# Patient Record
Sex: Female | Born: 1995 | Race: White | Hispanic: No | Marital: Married | Smoking: Never smoker
Health system: Southern US, Community
[De-identification: ages and names within clinical notes are randomized; demographics above are authoritative.]

## PROBLEM LIST (undated history)

## (undated) DIAGNOSIS — F419 Anxiety disorder, unspecified: Secondary | ICD-10-CM

## (undated) HISTORY — PX: ECTOPIC PREGNANCY SURGERY: SHX613

## (undated) HISTORY — DX: Anxiety disorder, unspecified: F41.9

## (undated) HISTORY — PX: KNEE SURGERY: SHX244

---
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
## 2021-06-29 ENCOUNTER — Ambulatory Visit (INDEPENDENT_AMBULATORY_CARE_PROVIDER_SITE_OTHER): Payer: BC Managed Care – PPO | Admitting: Certified Nurse Midwife

## 2021-06-29 ENCOUNTER — Other Ambulatory Visit: Payer: Self-pay

## 2021-06-29 ENCOUNTER — Encounter: Payer: Self-pay | Admitting: Certified Nurse Midwife

## 2021-06-29 VITALS — BP 116/78 | HR 76 | Ht 64.0 in | Wt 154.2 lb

## 2021-06-29 DIAGNOSIS — Z3200 Encounter for pregnancy test, result unknown: Secondary | ICD-10-CM | POA: Diagnosis not present

## 2021-06-29 LAB — POCT URINE PREGNANCY: Preg Test, Ur: NEGATIVE

## 2021-06-29 NOTE — Progress Notes (Signed)
Subjective:    Cindy Benson is a 25 y.o. female who presents for evaluation of amenorrhea. She states that she was 36 days late. She had 2 home test that were positive then had a negative test. Then had her periods. She notes that it was a little lighter but was more than spotting. Sexual Activity: single partner, contraception: natural family planning . Current symptoms also include:  none . Last period was late.   Patient's last menstrual period was 06/24/2021. The following portions of the patient's history were reviewed and updated as appropriate: allergies, current medications, past family history, past medical history, past social history, past surgical history, and problem list.  Review of Systems Pertinent items are noted in HPI.     Objective:    BP 116/78   Pulse 76   Ht 5\' 4"  (1.626 m)   Wt 154 lb 3.2 oz (69.9 kg)   LMP 06/24/2021   BMI 26.47 kg/m  General: alert, cooperative, appears stated age, and no acute distress    Lab Review Urine HCG: negative    Assessment:    Absence of menstruation.     Plan:    Pregnancy Test:   Negative: Patient reassured. Contraceptive counseling done. Planned contraceptive method: natural famiy planning. She is not interested in other types of birth control. Discussed cyclical changes that signal ovulation ie change in vaginal discharge, breast tenderness etc. Discussed possible causes of false positive test. She verbalizes and agrees. Offered beta quant testing . She declines at this time.   Follow up prn .   Face to face time 15 min.   08/25/2021, CNM

## 2021-09-21 ENCOUNTER — Encounter: Payer: Self-pay | Admitting: Certified Nurse Midwife

## 2021-09-21 ENCOUNTER — Other Ambulatory Visit: Payer: Self-pay

## 2021-09-21 ENCOUNTER — Ambulatory Visit: Payer: BC Managed Care – PPO | Admitting: Certified Nurse Midwife

## 2021-09-21 VITALS — BP 114/73 | HR 60 | Ht 64.0 in | Wt 159.8 lb

## 2021-09-21 DIAGNOSIS — Z3200 Encounter for pregnancy test, result unknown: Secondary | ICD-10-CM | POA: Diagnosis not present

## 2021-09-21 LAB — POCT URINE PREGNANCY: Preg Test, Ur: POSITIVE — AB

## 2021-09-21 MED ORDER — DOXYLAMINE-PYRIDOXINE 10-10 MG PO TBEC: 1.0000 | DELAYED_RELEASE_TABLET | Freq: Four times a day (QID) | ORAL | 5 refills | Status: DC

## 2021-09-21 NOTE — Progress Notes (Signed)
Subjective:    Cindy Benson is a 25 y.o. female who presents for evaluation of amenorrhea. She believes she could be pregnant. Pregnancy is desired. Sexual Activity: single partner, contraception: none. Current symptoms also include: breast tenderness and nausea. Last period was normal.   Patient's last menstrual period was 07/23/2021 (exact date). The following portions of the patient's history were reviewed and updated as appropriate: allergies, current medications, past family history, past medical history, past social history, past surgical history, and problem list.  Review of Systems Pertinent items are noted in HPI.     Objective:    BP 114/73   Pulse 60   Ht 5\' 4"  (1.626 m)   Wt 159 lb 12.8 oz (72.5 kg)   LMP 07/23/2021 (Exact Date)   BMI 27.43 kg/m  General: cooperative, appears stated age, and no acute distress    Lab Review Urine HCG: positive    Assessment:    Absence of menstruation.     Plan:    Pregnancy Test:  Positive: EDC: 04/29/2022. Briefly discussed pre-natal care options. Midwifery and MD care reviewed. Plans to see midwives. Encouraged well-balanced diet, plenty of rest when needed, pre-natal vitamins daily and walking for exercise. Discussed self-help for nausea, avoiding OTC medications until consulting provider or pharmacist, other than Tylenol as needed, minimal caffeine (1-2 cups daily) and avoiding alcohol. She will schedule u/s for dating and family hx twins, nurse visit in 2 wks,  her initial OB visit 4 wk .  Feel free to call with any questions. Orders placed for diclegis.   06/29/2022, CNM

## 2021-09-25 ENCOUNTER — Telehealth: Payer: Self-pay | Admitting: Certified Nurse Midwife

## 2021-09-25 NOTE — Telephone Encounter (Signed)
LVM for patient to return call. 

## 2021-09-25 NOTE — Telephone Encounter (Signed)
Pt called stating that she started having nausea, weakness and stomach pain around 4am. Pt states that she is 9 weeks and can not eat.

## 2021-09-25 NOTE — Telephone Encounter (Signed)
Pt states she is having severe nausea & vomiting and is unable to eat or keep any fluids down. Advised pt that doxylamine was sent to her pharmacy and that she should begin taking. Also instructed patient to try methods listed in pregnancy packed in regards to nausea/vomiting in addition to BRAT diet & pushing fluids when she can. Pt agrees to plan and verbalized no further questions/concerns.

## 2021-09-28 ENCOUNTER — Ambulatory Visit (INDEPENDENT_AMBULATORY_CARE_PROVIDER_SITE_OTHER): Payer: BC Managed Care – PPO

## 2021-09-28 ENCOUNTER — Other Ambulatory Visit: Payer: Self-pay

## 2021-09-28 DIAGNOSIS — Z3200 Encounter for pregnancy test, result unknown: Secondary | ICD-10-CM | POA: Diagnosis not present

## 2021-10-05 ENCOUNTER — Encounter: Payer: Self-pay | Admitting: Certified Nurse Midwife

## 2021-10-12 ENCOUNTER — Other Ambulatory Visit: Payer: Self-pay

## 2021-10-12 ENCOUNTER — Ambulatory Visit (INDEPENDENT_AMBULATORY_CARE_PROVIDER_SITE_OTHER): Payer: BC Managed Care – PPO | Admitting: Certified Nurse Midwife

## 2021-10-12 VITALS — BP 101/66 | HR 58 | Resp 16 | Ht 64.0 in | Wt 153.7 lb

## 2021-10-12 DIAGNOSIS — Z3401 Encounter for supervision of normal first pregnancy, first trimester: Secondary | ICD-10-CM

## 2021-10-12 DIAGNOSIS — Z113 Encounter for screening for infections with a predominantly sexual mode of transmission: Secondary | ICD-10-CM

## 2021-10-12 DIAGNOSIS — Z3A11 11 weeks gestation of pregnancy: Secondary | ICD-10-CM | POA: Diagnosis not present

## 2021-10-12 LAB — OB RESULTS CONSOLE VARICELLA ZOSTER ANTIBODY, IGG: Varicella: IMMUNE

## 2021-10-12 NOTE — Progress Notes (Signed)
Weston Settle Mckinny presents for NOB nurse interview visit. Pregnancy confirmation done 09/21/2021.  [id number]. Pregnancy education material explained and given. 1 cat in the home. NOB labs ordered. HIV labs and Drug screen were explained optional and she did not decline. Drug screen ordered. PNV encouraged. Genetic screening options discussed. Genetic testing: Ordered.  Pt may discuss with provider.  Financial policy reviewed. FMLA form reviewed and signed. Pt. To follow up with provider in 1 week for NOB physical.  All questions answered.

## 2021-10-13 LAB — PARVOVIRUS B19 ANTIBODY, IGG AND IGM
Parvovirus B19 IgG: 7.1 index — ABNORMAL HIGH (ref 0.0–0.8)
Parvovirus B19 IgM: 1.1 index — ABNORMAL HIGH (ref 0.0–0.8)

## 2021-10-13 LAB — ANTIBODY SCREEN: Antibody Screen: NEGATIVE

## 2021-10-13 LAB — URINALYSIS, ROUTINE W REFLEX MICROSCOPIC
Bilirubin, UA: NEGATIVE
Glucose, UA: NEGATIVE
Ketones, UA: NEGATIVE
Leukocytes,UA: NEGATIVE
Nitrite, UA: NEGATIVE
RBC, UA: NEGATIVE
Specific Gravity, UA: >=1.03 — AB (ref 1.005–1.030)
Urobilinogen, Ur: 1 mg/dL (ref 0.2–1.0)
pH, UA: 6 (ref 5.0–7.5)

## 2021-10-13 LAB — HIV ANTIBODY (ROUTINE TESTING W REFLEX): HIV Screen 4th Generation wRfx: NONREACTIVE

## 2021-10-13 LAB — VIRAL HEPATITIS HBV, HCV
HCV Ab: <0.1 s/co ratio (ref 0.0–0.9)
Hep B Core Total Ab: NEGATIVE
Hep B Surface Ab, Qual: NONREACTIVE
Hepatitis B Surface Ag: NEGATIVE

## 2021-10-13 LAB — VARICELLA ZOSTER ANTIBODY, IGG: Varicella zoster IgG: 714 index (ref >165)

## 2021-10-13 LAB — ABO AND RH: Rh Factor: POSITIVE

## 2021-10-13 LAB — TOXOPLASMA ANTIBODIES- IGG AND  IGM
Toxoplasma Antibody- IgM: <3 AU/mL (ref 0.0–7.9)
Toxoplasma IgG Ratio: <3 IU/mL (ref 0.0–7.1)

## 2021-10-13 LAB — RPR: RPR Ser Ql: NONREACTIVE

## 2021-10-13 LAB — HCV INTERPRETATION

## 2021-10-13 LAB — RUBELLA SCREEN: Rubella Antibodies, IGG: 8.27 index (ref >0.99)

## 2021-10-14 LAB — URINE CULTURE, OB REFLEX

## 2021-10-14 LAB — PAIN MGT SCRN (14 DRUGS), UR
Amphetamine Scrn, Ur: NEGATIVE ng/mL
BARBITURATE SCREEN URINE: NEGATIVE ng/mL
BENZODIAZEPINE SCREEN, URINE: NEGATIVE ng/mL
Buprenorphine, Urine: NEGATIVE ng/mL
CANNABINOIDS UR QL SCN: NEGATIVE ng/mL
Cocaine (Metab) Scrn, Ur: NEGATIVE ng/mL
Creatinine(Crt), U: 195.8 mg/dL (ref 20.0–300.0)
Fentanyl, Urine: NEGATIVE pg/mL
Meperidine Screen, Urine: NEGATIVE ng/mL
Methadone Screen, Urine: NEGATIVE ng/mL
OXYCODONE+OXYMORPHONE UR QL SCN: NEGATIVE ng/mL
Opiate Scrn, Ur: NEGATIVE ng/mL
Ph of Urine: 5.7 (ref 4.5–8.9)
Phencyclidine Qn, Ur: NEGATIVE ng/mL
Propoxyphene Scrn, Ur: NEGATIVE ng/mL
Tramadol Screen, Urine: NEGATIVE ng/mL

## 2021-10-14 LAB — GC/CHLAMYDIA PROBE AMP
Chlamydia trachomatis, NAA: NEGATIVE
Neisseria Gonorrhoeae by PCR: NEGATIVE

## 2021-10-14 LAB — NICOTINE SCREEN, URINE: Cotinine Ql Scrn, Ur: NEGATIVE ng/mL

## 2021-10-14 LAB — CULTURE, OB URINE

## 2021-10-19 ENCOUNTER — Other Ambulatory Visit: Payer: Self-pay

## 2021-10-19 ENCOUNTER — Encounter: Payer: Self-pay | Admitting: Certified Nurse Midwife

## 2021-10-19 ENCOUNTER — Ambulatory Visit (INDEPENDENT_AMBULATORY_CARE_PROVIDER_SITE_OTHER): Payer: BC Managed Care – PPO | Admitting: Certified Nurse Midwife

## 2021-10-19 ENCOUNTER — Other Ambulatory Visit (HOSPITAL_COMMUNITY)
Admission: RE | Admit: 2021-10-19 | Discharge: 2021-10-19 | Disposition: A | Discharge status: Home / Self Care | Payer: BC Managed Care – PPO | Source: Ambulatory Visit | Attending: Certified Nurse Midwife | Admitting: Certified Nurse Midwife

## 2021-10-19 VITALS — BP 114/74 | HR 52 | Wt 154.4 lb

## 2021-10-19 DIAGNOSIS — Z3A12 12 weeks gestation of pregnancy: Secondary | ICD-10-CM

## 2021-10-19 DIAGNOSIS — Z124 Encounter for screening for malignant neoplasm of cervix: Secondary | ICD-10-CM

## 2021-10-19 DIAGNOSIS — Z1379 Encounter for other screening for genetic and chromosomal anomalies: Secondary | ICD-10-CM | POA: Diagnosis not present

## 2021-10-19 DIAGNOSIS — Z3481 Encounter for supervision of other normal pregnancy, first trimester: Secondary | ICD-10-CM

## 2021-10-19 LAB — POCT URINALYSIS DIPSTICK OB
Bilirubin, UA: NEGATIVE
Blood, UA: NEGATIVE
Glucose, UA: NEGATIVE
Ketones, UA: NEGATIVE
Nitrite, UA: NEGATIVE
POC,PROTEIN,UA: NEGATIVE
Spec Grav, UA: 1.015 (ref 1.010–1.025)
Urobilinogen, UA: 0.2 E.U./dL
pH, UA: 7 (ref 5.0–8.0)

## 2021-10-19 NOTE — Progress Notes (Signed)
NEW OB HISTORY AND PHYSICAL  SUBJECTIVE:       Cindy Benson is a 25 y.o. G61P0011 female, Patient's last menstrual period was 07/23/2021 (exact date)., Estimated Date of Delivery: 04/29/22, [redacted]w[redacted]d, presents today for establishment of Prenatal Care. She complains of nausea.   Relationship: married Living: son and spouse Work: Scientist, forensic Exercise:none outside of work Denies alcohol, drugs and smoking   Gynecologic History Patient's last menstrual period was 07/23/2021 (exact date). Normal Contraception: none Last Pap: unsure. Results were: normal  Obstetric History OB History  Gravida Para Term Preterm AB Living  3 1     1 1   SAB IAB Ectopic Multiple Live Births  1            # Outcome Date GA Lbr Len/2nd Weight Sex Delivery Anes PTL Lv  3 Current           2 Para           1 SAB             Past Medical History:  Diagnosis Date   Anxiety     No past surgical history on file.  Current Outpatient Medications on File Prior to Visit  Medication Sig Dispense Refill   Prenatal Vit-Fe Fumarate-FA (PRENATAL MULTIVITAMIN) TABS tablet Take 1 tablet by mouth daily at 12 noon.     No current facility-administered medications on file prior to visit.    Allergies  Allergen Reactions   Ibuprofen Nausea And Vomiting    Social History   Socioeconomic History   Marital status: Married    Spouse name: Not on file   Number of children: Not on file   Years of education: Not on file   Highest education level: Not on file  Occupational History   Occupation: : [employer]  Tobacco Use   Smoking status: Never   Smokeless tobacco: Never  Vaping Use   Vaping Use: Never used  Substance and Sexual Activity   Alcohol use: Never   Drug use: Never   Sexual activity: Yes    Birth control/protection: None  Other Topics Concern   Not on file  Social History Narrative   Not on file   Social Determinants of Health   Financial Resource Strain:  Not on file  Food Insecurity: Not on file  Transportation Needs: Not on file  Physical Activity: Not on file  Stress: Not on file  Social Connections: Not on file  Intimate Partner Violence: Not on file    Family History  Problem Relation Age of Onset   Hypertension Father    Breast cancer Neg Hx    Cervical cancer Neg Hx    Ovarian cancer Neg Hx    Colon cancer Neg Hx     The following portions of the patient's history were reviewed and updated as appropriate: allergies, current medications, past OB history, past medical history, past surgical history, past family history, past social history, and problem list.    OBJECTIVE: Initial Physical Exam (New OB)  GENERAL APPEARANCE: alert, well appearing, in no apparent distress, oriented to person, place and time HEAD: normocephalic, atraumatic MOUTH: mucous membranes moist, pharynx normal without lesions THYROID: no thyromegaly or masses present BREASTS: no masses noted, no significant tenderness, no palpable axillary nodes, no skin changes LUNGS: clear to auscultation, no wheezes, rales or rhonchi, symmetric air entry HEART: regular rate and rhythm, no murmurs ABDOMEN: soft, nontender, nondistended, no abnormal masses, no epigastric pain and  FHT present EXTREMITIES: no redness or tenderness in the calves or thighs, no edema, no limitation in range of motion, intact peripheral pulses SKIN: normal coloration and turgor, no rashes LYMPH NODES: no adenopathy palpable NEUROLOGIC: alert, oriented, normal speech, no focal findings or movement disorder noted  PELVIC EXAM EXTERNAL GENITALIA: normal appearing vulva with no masses, tenderness or lesions VAGINA: no abnormal discharge or lesions CERVIX: no lesions or cervical motion tenderness,pap collected, contact bleeding  UTERUS: gravid ADNEXA: no masses palpable and nontender OB EXAM PELVIMETRY: appears adequate RECTUM: exam not indicated  ASSESSMENT: Normal  pregnancy  PLAN: Prenatal care See orders New OB counseling: The patient has been given an overview regarding routine prenatal care. Recommendations regarding diet, weight gain, and exercise in pregnancy were given. Prenatal testing, optional genetic testing, and ultrasound use in pregnancy were reviewed.  Benefits of Breast Feeding were discussed. The patient is encouraged to consider nursing her baby post partum. ROB 4 wks.   Doreene Burke, CNM

## 2021-10-19 NOTE — Patient Instructions (Signed)
Pregnancy and Parvovirus Infection A parvovirus infection, also called fifth disease, is an illness that is caused by a virus. The virus is contagious and spreads to others by: The droplets that are sprayed into the air when an infected person talks, coughs, and sneezes. Touching infected saliva or mucus of an ill child. Many pregnant women have had the infection prior to pregnancy, so they develop resistance (immunity) and cannot become infected with the virus again. How does this affect me? Most pregnant women who get parvovirus have only a mild illness from it. In some cases, this condition may not cause any symptoms. However, if you do develop symptoms, you may have: Tiredness. Sore throat. Runny nose. Cough. Shortness of breath. Low-grade fever. Upset stomach. Several days into the illness, you may develop: A bright red rash on both cheeks. This is sometimes called a slapped-cheek rash. A pink, lacy rash on the body, arms, and legs. This rash may come and go for up to 5 weeks. It may get brighter after you take a warm bath, exercise, or are out in the sun. Stiffness and pain in the joints. Usually, the joints in the hands, wrists, and ankles are the ones that are affected. This symptom may also last for weeks. How does this affect my baby? Most of the time, babies are not affected when a mother has parvovirus. If you develop this illness during the first half of your pregnancy, there is a small chance that the virus may pass to your unborn baby and cause serious problems. Problems may include: A low number of red blood cells (anemia). A condition that causes swelling in your unborn baby (hydrops fetalis). In rare cases, fetal death. How is this treated? Usually, no treatment is necessary if you are in good health. Your health care provider may suggest that you take over-the-counter medicine for your symptoms. Pregnant women who develop parvovirus may need blood tests or ultrasound exams  to monitor how the infection is affecting the developing baby. People with severe anemia may require a blood transfusion. A person with an immune disorder may need to receive injections of antibodies (immune globulin). If your unborn baby develops severe anemia, the baby can receive a blood transfusion before being born (in utero), or you may be given medicines to combat the infection. Follow these instructions at home:  Take over-the-counter and prescription medicines only as told by your health care provider. Drink enough fluid to keep your urine pale yellow. Take steps to prevent the spread of the disease while it is still contagious. A parvovirus infection is contagious for about 5-10 days before the rash develops. While it is contagious, keep it from spreading to others by: Avoiding close contact with others. Washing your hands often with soap and water. If soap and water are not available, use hand sanitizer. Contact a health care provider if: Your rash becomes itchy and bothersome. You believe you have been exposed to parvovirus. Get help right away if: You seem to be getting worse. You develop new symptoms. You have severe joint pain, swelling, or stiffness. Summary A parvovirus infection, also called fifth disease, is an illness that is caused by a virus. Most pregnant women who get parvovirus have only a mild illness from it, and their babies do not have any problems caused by the infection. If you believe you have been exposed to parvovirus during pregnancy, seek care from your health care provider. This information is not intended to replace advice given to you by  your health care provider. Make sure you discuss any questions you have with your health care provider. Document Revised: 03/30/2019 Document Reviewed: 01/11/2018 Elsevier Patient Education  2022 [employer].

## 2021-10-20 LAB — CBC WITH DIFFERENTIAL/PLATELET
Basophils Absolute: 0 10*3/uL (ref 0.0–0.2)
Basos: 0 %
EOS (ABSOLUTE): 0.1 10*3/uL (ref 0.0–0.4)
Eos: 1 %
Hematocrit: 35.5 % (ref 34.0–46.6)
Hemoglobin: 12.6 g/dL (ref 11.1–15.9)
Immature Grans (Abs): 0 10*3/uL (ref 0.0–0.1)
Immature Granulocytes: 0 %
Lymphocytes Absolute: 1.8 10*3/uL (ref 0.7–3.1)
Lymphs: 26 %
MCH: 31.3 pg (ref 26.6–33.0)
MCHC: 35.5 g/dL (ref 31.5–35.7)
MCV: 88 fL (ref 79–97)
Monocytes Absolute: 0.4 10*3/uL (ref 0.1–0.9)
Monocytes: 6 %
Neutrophils Absolute: 4.7 10*3/uL (ref 1.4–7.0)
Neutrophils: 67 %
Platelets: 257 10*3/uL (ref 150–450)
RBC: 4.03 x10E6/uL (ref 3.77–5.28)
RDW: 11.7 % (ref 11.7–15.4)
WBC: 7 10*3/uL (ref 3.4–10.8)

## 2021-10-22 LAB — CYTOLOGY - PAP: Diagnosis: NEGATIVE

## 2021-10-25 LAB — MATERNIT21  PLUS CORE+ESS+SCA, BLOOD

## 2021-11-20 ENCOUNTER — Ambulatory Visit (INDEPENDENT_AMBULATORY_CARE_PROVIDER_SITE_OTHER): Payer: BC Managed Care – PPO | Admitting: Certified Nurse Midwife

## 2021-11-20 ENCOUNTER — Other Ambulatory Visit: Payer: Self-pay

## 2021-11-20 VITALS — BP 132/72 | HR 65 | Wt 151.2 lb

## 2021-11-20 DIAGNOSIS — Z3A17 17 weeks gestation of pregnancy: Secondary | ICD-10-CM

## 2021-11-20 DIAGNOSIS — Z3402 Encounter for supervision of normal first pregnancy, second trimester: Secondary | ICD-10-CM

## 2021-11-20 LAB — POCT URINALYSIS DIPSTICK OB
Bilirubin, UA: NEGATIVE
Blood, UA: NEGATIVE
Glucose, UA: NEGATIVE
Ketones, UA: NEGATIVE
Leukocytes, UA: NEGATIVE
Nitrite, UA: NEGATIVE
POC,PROTEIN,UA: NEGATIVE
Spec Grav, UA: 1.01 (ref 1.010–1.025)
Urobilinogen, UA: 0.2 E.U./dL
pH, UA: 7 (ref 5.0–8.0)

## 2021-11-20 NOTE — Patient Instructions (Signed)
Round Ligament Pain The round ligaments are a pair of cord-like tissues that help support the uterus. They can become a source of pain during pregnancy as the ligaments soften and stretch as the baby grows. The pain usually begins in the second trimester (13-28 weeks) of pregnancy, and should only last for a few seconds when it occurs. However, the pain can come and go until the baby is delivered. The pain does not cause harm to the baby. Round ligament pain is usually a short, sharp, and pinching pain, but it can also be a dull, lingering, and aching pain. The pain is felt in the lower side of the abdomen or in the groin. It usually starts deep in the groin and moves up to the outside of the hip area. The pain may happen when you: Suddenly change position, such as quickly going from a sitting to standing position. Do physical activity. Cough or sneeze. Follow these instructions at home: Managing pain  When the pain starts, relax. Then, try any of these methods to help with the pain: Sit down. Flex your knees up to your abdomen. Lie on your side with one pillow under your abdomen and another pillow between your legs. Sit in a warm bath for 15-20 minutes or until the pain goes away. General instructions Watch your condition for any changes. Move slowly when you sit down or stand up. Stop or reduce your physical activities if they cause pain. Avoid long walks if they cause pain. Take over-the-counter and prescription medicines only as told by your health care provider. Keep all follow-up visits. This is important. Contact a health care provider if: Your pain does not go away with treatment. You feel pain in your back that you did not have before. Your medicine is not helping. You have a fever or chills. You have nausea or vomiting. You have diarrhea. You have pain when you urinate. Get help right away if: You have pain that is a rhythmic, cramping pain similar to labor pains. Labor pains  are usually 2 minutes apart, last for about 1 minute, and involve a bearing down feeling or pressure in your pelvis. You have vaginal bleeding. These symptoms may represent a serious problem that is an emergency. Do not wait to see if the symptoms will go away. Get medical help right away. Call your local emergency services (911 in the [place] Do not drive yourself to the hospital. Summary Round ligament pain is felt in the lower abdomen or groin. This pain usually begins in the second trimester (13-28 weeks) and should only last for a few seconds when it occurs. You may notice the pain when you suddenly change position, when you cough or sneeze, or during physical activity. Relaxing, flexing your knees to your abdomen, lying on one side, or taking a warm bath may help to get rid of the pain. Contact your health care provider if the pain does not go away. This information is not intended to replace advice given to you by your health care provider. Make sure you discuss any questions you have with your health care provider. Document Revised: 02/18/2021 Document Reviewed: 02/18/2021 Elsevier Patient Education  2022 Else[employer] Inc.  

## 2021-11-20 NOTE — Progress Notes (Signed)
ROB doing well, denies any concerns. Discussed u/s for anatomy 3 wks , she verbalizes and agrees. Follow up 3 wks or prn .   Doreene Burke, CNM

## 2021-11-27 ENCOUNTER — Ambulatory Visit: Payer: BC Managed Care – PPO

## 2021-12-10 ENCOUNTER — Telehealth: Payer: Self-pay

## 2021-12-10 ENCOUNTER — Observation Stay
Admission: EM | Admit: 2021-12-10 | Discharge: 2021-12-10 | Disposition: A | Discharge status: Home / Self Care | Payer: BC Managed Care – PPO | Attending: Obstetrics and Gynecology | Admitting: Obstetrics and Gynecology

## 2021-12-10 DIAGNOSIS — O0992 Supervision of high risk pregnancy, unspecified, second trimester: Secondary | ICD-10-CM | POA: Diagnosis not present

## 2021-12-10 DIAGNOSIS — O26892 Other specified pregnancy related conditions, second trimester: Secondary | ICD-10-CM | POA: Diagnosis not present

## 2021-12-10 DIAGNOSIS — O99891 Other specified diseases and conditions complicating pregnancy: Principal | ICD-10-CM | POA: Insufficient documentation

## 2021-12-10 DIAGNOSIS — R350 Frequency of micturition: Secondary | ICD-10-CM | POA: Insufficient documentation

## 2021-12-10 DIAGNOSIS — Z3A20 20 weeks gestation of pregnancy: Secondary | ICD-10-CM | POA: Insufficient documentation

## 2021-12-10 DIAGNOSIS — R109 Unspecified abdominal pain: Secondary | ICD-10-CM

## 2021-12-10 LAB — URINALYSIS, COMPLETE (UACMP) WITH MICROSCOPIC
Bacteria, UA: NONE SEEN
Bilirubin Urine: NEGATIVE
Glucose, UA: NEGATIVE mg/dL
Hgb urine dipstick: NEGATIVE
Ketones, ur: 40 mg/dL — AB
Nitrite: NEGATIVE
Protein, ur: NEGATIVE mg/dL
RBC / HPF: NONE SEEN RBC/hpf (ref 0–5)
Specific Gravity, Urine: 1.02 (ref 1.005–1.030)
pH: 7 (ref 5.0–8.0)

## 2021-12-10 NOTE — Telephone Encounter (Signed)
Patient called and stated that she has been having some sharp pains  and tightness in abdomen since she woke up this morning. Pain comes and goes, when pain hits it is severe. She also stated that she has been having some urinary frequency and the pain she is having is on her side next to her ribs. I spoke with Dr. Logan Bores and he has advised that patient got to L&D to rule out bladder infection and to make sure she is not contracting. Called patient she verbalized understanding.

## 2021-12-10 NOTE — Progress Notes (Signed)
Patient is stable and VS within normal limits. Fetal Heart Rate monitored for an adequate amount of time. Patient informed to follow up with primary OB doctor within 24 hours.   

## 2021-12-10 NOTE — Progress Notes (Signed)
No LOF, vaginal discharge or vaginal bleeding.

## 2021-12-10 NOTE — OB Triage Note (Signed)
Pt c/o ctx every 10 mins apart that started this morning around 0600. She also c/o urinary frequency.

## 2021-12-13 NOTE — Discharge Summary (Signed)
° ° °  L&D OB Triage Note  SUBJECTIVE Cindy Benson is a 25 y.o. G29P1011 female at [redacted]w[redacted]d, EDD Estimated Date of Delivery: 04/29/22 who presented to triage with complaints of contractions every 10 minutes.  She denies vaginal discharge, bleeding.  She does complain of urinary frequency.  OB History  Gravida Para Term Preterm AB Living  3 1 1  0 1 1  SAB IAB Ectopic Multiple Live Births  1 0 0 0 0    # Outcome Date GA Lbr Len/2nd Weight Sex Delivery Anes PTL Lv  3 Current           2 Term           1 SAB             No medications prior to admission.     OBJECTIVE  Nursing Evaluation:   LMP 07/23/2021 (Exact Date)    Findings:        Patient not in labor.     No obvious urinary tract infection by U/A      NST was performed and has been reviewed by me.  NST INTERPRETATION: Appropriate for gestational age.  Mode: External Baseline Rate (A): 135 bpm              ASSESSMENT Impression:  1.  Pregnancy:  G3P1011 at [redacted]w[redacted]d , EDD Estimated Date of Delivery: 04/29/22 2.  Reassuring fetal and maternal status  PLAN 1. Current condition and above findings reviewed.  Reassuring fetal and maternal condition. 2. Discharge home with standard labor precautions given to return to L&D or call the office for problems. 3. Continue routine prenatal care.

## 2021-12-16 ENCOUNTER — Encounter: Payer: BC Managed Care – PPO | Admitting: Certified Nurse Midwife

## 2021-12-20 NOTE — L&D Delivery Note (Signed)
Delivery Note  ? ?Cindy Benson is a 26 y.o. G3P1011 at [redacted]w[redacted]d Estimated Date of Delivery: 04/29/22 ? ?PRE-OPERATIVE DIAGNOSIS:  ?1) [redacted]w[redacted]d pregnancy.  ? ?POST-OPERATIVE DIAGNOSIS:  ?1) [redacted]w[redacted]d pregnancy s/p Vaginal, Spontaneous  ? ?Delivery Type: Vaginal, Spontaneous   ? ?Delivery Anesthesia: Epidural  ? ?Labor Complications:  None ?  ? ?ESTIMATED BLOOD LOSS: 250  ml   ? ?FINDINGS:   ?1) female infant, Apgar scores of    at 1 minute and    at 5 minutes and a birthweight of   ounces.   ? ? ?SPECIMENS:  ? PLACENTA: ?  Appearance: Intact  ?  Removal: Spontaneous    ?  Disposition:  Per protocol ? CORD BLOOD: Collected ? ?DISPOSITION:  ?Infant to left in stable condition in the delivery room, with L&D personnel and mother, ? ?NARRATIVE SUMMARY: ?Labor course:  Cindy Benson is a G3P1011 at [redacted]w[redacted]d who presented to Labor & Delivery for labor management. Her initial cervical exam was 4/70/-3. Labor proceeded spontaneously and she was found to be completely dilated at 0150. ?With excellent maternal pushing effort, she birthed a viable female infant at 76. A nuchal cord and compound hand were noted. The shoulders were birthed without difficulty. The infant was placed skin-to-skin with Cindy Benson. The cord was doubly clamped and cut when pulsations ceased. ?The placenta delivered spontaneously and was noted to be intact with a 3VC. ?A perineal and vaginal examination was performed. ?Lacerations: 1st degree;Perineal  ?Lacerations were repaired with Vicryl suture using local anesthesia. ?The patient tolerated this well. Mother and baby were left in stable condition. ?Dr. Logan Bores precepted this birth. ? ?[name], CNM ?04/13/2022 ?3:10 AM  ?

## 2021-12-22 ENCOUNTER — Other Ambulatory Visit: Payer: Self-pay | Admitting: Obstetrics and Gynecology

## 2021-12-22 ENCOUNTER — Other Ambulatory Visit (INDEPENDENT_AMBULATORY_CARE_PROVIDER_SITE_OTHER): Payer: BC Managed Care – PPO

## 2021-12-22 ENCOUNTER — Other Ambulatory Visit: Payer: Self-pay

## 2021-12-22 DIAGNOSIS — Z3402 Encounter for supervision of normal first pregnancy, second trimester: Secondary | ICD-10-CM

## 2021-12-28 ENCOUNTER — Ambulatory Visit (INDEPENDENT_AMBULATORY_CARE_PROVIDER_SITE_OTHER): Payer: BC Managed Care – PPO | Admitting: Certified Nurse Midwife

## 2021-12-28 ENCOUNTER — Encounter: Payer: Self-pay | Admitting: Certified Nurse Midwife

## 2021-12-28 ENCOUNTER — Other Ambulatory Visit: Payer: Self-pay

## 2021-12-28 VITALS — BP 123/74 | HR 64 | Wt 151.7 lb

## 2021-12-28 DIAGNOSIS — Z3A22 22 weeks gestation of pregnancy: Secondary | ICD-10-CM

## 2021-12-28 DIAGNOSIS — Z3482 Encounter for supervision of other normal pregnancy, second trimester: Secondary | ICD-10-CM

## 2021-12-28 LAB — POCT URINALYSIS DIPSTICK OB
Bilirubin, UA: NEGATIVE
Blood, UA: NEGATIVE
Glucose, UA: NEGATIVE
Ketones, UA: NEGATIVE
Leukocytes, UA: NEGATIVE
Nitrite, UA: NEGATIVE
POC,PROTEIN,UA: NEGATIVE
Spec Grav, UA: 1.02 (ref 1.010–1.025)
Urobilinogen, UA: 0.2 E.U./dL
pH, UA: 6.5 (ref 5.0–8.0)

## 2021-12-28 NOTE — Patient Instructions (Signed)
Round Ligament Pain The round ligaments are a pair of cord-like tissues that help support the uterus. They can become a source of pain during pregnancy as the ligaments soften and stretch as the baby grows. The pain usually begins in the second trimester (13-28 weeks) of pregnancy, and should only last for a few seconds when it occurs. However, the pain can come and go until the baby is delivered. The pain does not cause harm to the baby. Round ligament pain is usually a short, sharp, and pinching pain, but it can also be a dull, lingering, and aching pain. The pain is felt in the lower side of the abdomen or in the groin. It usually starts deep in the groin and moves up to the outside of the hip area. The pain may happen when you: Suddenly change position, such as quickly going from a sitting to standing position. Do physical activity. Cough or sneeze. Follow these instructions at home: Managing pain  When the pain starts, relax. Then, try any of these methods to help with the pain: Sit down. Flex your knees up to your abdomen. Lie on your side with one pillow under your abdomen and another pillow between your legs. Sit in a warm bath for 15-20 minutes or until the pain goes away. General instructions Watch your condition for any changes. Move slowly when you sit down or stand up. Stop or reduce your physical activities if they cause pain. Avoid long walks if they cause pain. Take over-the-counter and prescription medicines only as told by your health care provider. Keep all follow-up visits. This is important. Contact a health care provider if: Your pain does not go away with treatment. You feel pain in your back that you did not have before. Your medicine is not helping. You have a fever or chills. You have nausea or vomiting. You have diarrhea. You have pain when you urinate. Get help right away if: You have pain that is a rhythmic, cramping pain similar to labor pains. Labor pains  are usually 2 minutes apart, last for about 1 minute, and involve a bearing down feeling or pressure in your pelvis. You have vaginal bleeding. These symptoms may represent a serious problem that is an emergency. Do not wait to see if the symptoms will go away. Get medical help right away. Call your local emergency services (911 in the [place] Do not drive yourself to the hospital. Summary Round ligament pain is felt in the lower abdomen or groin. This pain usually begins in the second trimester (13-28 weeks) and should only last for a few seconds when it occurs. You may notice the pain when you suddenly change position, when you cough or sneeze, or during physical activity. Relaxing, flexing your knees to your abdomen, lying on one side, or taking a warm bath may help to get rid of the pain. Contact your health care provider if the pain does not go away. This information is not intended to replace advice given to you by your health care provider. Make sure you discuss any questions you have with your health care provider. Document Revised: 02/18/2021 Document Reviewed: 02/18/2021 Elsevier Patient Education  2022 Else[employer] Inc.  

## 2021-12-28 NOTE — Progress Notes (Signed)
ROB doing well, feeling movement. U/s 1/3 for anatomy reviewed. Pt interested in BTL . Will sign consent at 28 wk visit. Has questions about traveling at 34 wks for weekend to [place], plans to drive. Precautions reviewed. Follow up for ROB 3 wks with Cindy Benson.   Cindy Benson, CNM  Patient Name: Cindy Benson DOB: 09-Oct-1996 MRN: [id number]  ULTRASOUND REPORT  Location: [facility] Date of Service: 12/22/2021   Indications:Anatomy Ultrasound Findings:  Cindy Benson intrauterine pregnancy is visualized with FHR at 132 BPM.   Biometrics give an (U/S) Gestational age of [redacted]w[redacted]d and an (U/S) EDD of 04/27/22; this correlates with the clinically established Estimated Date of Delivery: 04/29/22   Fetal presentation is Breech.  EFW: 491g / 1 lb 1 oz. Placenta: anterior. Grade: 0 AFI: subjectively normal.  Anatomic survey is complete and normal; Gender - female.    Ovaries are not visualized. Survey of the adnexa demonstrates no adnexal masses.  There is no free peritoneal fluid in the cul de sac.  Impression: 1. [redacted]w[redacted]d Viable Singleton Intrauterine pregnancy by U/S. 2. (U/S) EDD is consistent with Clinically established Estimated Date of Delivery: 04/29/22 . 3. Normal Anatomy Scan  Recommendations: 1.Clinical correlation with the patient's History and Physical Exam.  Lise Auer     I have reviewed this study and agree with documented findings.    Hildred Laser, MD [facility]

## 2022-01-12 ENCOUNTER — Telehealth: Payer: Self-pay | Admitting: Certified Nurse Midwife

## 2022-01-12 NOTE — Telephone Encounter (Signed)
Pt states that she is currently 25 weeks and has a cold, told has been having numbness and tingling in both your feet and legs, back pain. Pt noticed a hard spot on her stomach - she is asking if this is normal. Please Advise.

## 2022-01-12 NOTE — Telephone Encounter (Signed)
LM with patient via MyChart message.

## 2022-01-14 ENCOUNTER — Encounter: Payer: Self-pay | Admitting: Obstetrics

## 2022-01-14 ENCOUNTER — Telehealth: Payer: BC Managed Care – PPO | Admitting: Obstetrics

## 2022-01-14 DIAGNOSIS — N393 Stress incontinence (female) (male): Secondary | ICD-10-CM | POA: Diagnosis not present

## 2022-01-14 DIAGNOSIS — O98512 Other viral diseases complicating pregnancy, second trimester: Secondary | ICD-10-CM | POA: Diagnosis not present

## 2022-01-14 DIAGNOSIS — U071 Other viral diseases complicating pregnancy, second trimester: Secondary | ICD-10-CM

## 2022-01-14 NOTE — Progress Notes (Signed)
Virtual Visit via Video Note  I connected with Cindy Benson on 01/14/22 at 10:00 AM EST by a video enabled telemedicine application and verified that I am speaking with the correct person using two identifiers.  Location: Patient: Home Provider: Office   I discussed the limitations of evaluation and management by telemedicine and the availability of in person appointments. The patient expressed understanding and agreed to proceed.  History of Present Illness:  Cindy Benson is a 26 y.o. G3P1011 at [redacted]w[redacted]d. Yesterday she tested positive for COVID. Her symptoms include cough, runny nose, and loss of sense of smell/taste. She has been afebrile and denies SOB, N/V/D. She has been able to tolerate fluids and food. She had been experiencing tingling and numbness in her extremities and back pain, but those have resolved. She is also concerned about stress and urge incontinence. She denies dysuria, hematuria, and urinary frequency.   Observations/Objective:  Cindy Benson has an occasional cough throughout the visit no shortness of breath.    Assessment and Plan: 1) Reviewed treatment of COVID symptoms PRN. Encouraged sinus rinse for nasal congestion. Cindy Benson states that she has the handout of medications that are safe to take in pregnancy. 2) Reviewed CDC guidelines for isolation and masking. Encouraged Cindy Benson to allow partner to care for their 20-year-old as much as possible. 3) Discussed danger signs and when to call or go to the hospital including fever that is not responsive to Tylenol, shortness of breath, intractable vomiting, or s/s of dehydration. 4) Recommended bladder training regimen and use of pads PRN. Sent information on Kegel exercises. Cindy Benson desires pelvic PT referral. Referral sent. 5) Keep scheduled office visit for 01/22/22.  Follow Up Instructions:   I discussed the assessment and treatment plan with the patient. The patient was provided an opportunity to ask questions and all were  answered. The patient agreed with the plan and demonstrated an understanding of the instructions.   The patient was advised to call back or seek an in-person evaluation if the symptoms worsen or if the condition fails to improve as anticipated.  I provided 8 minutes of non-face-to-face time during this encounter.   Lurlean Horns, CNM

## 2022-01-22 ENCOUNTER — Ambulatory Visit (INDEPENDENT_AMBULATORY_CARE_PROVIDER_SITE_OTHER): Payer: BC Managed Care – PPO | Admitting: Obstetrics

## 2022-01-22 ENCOUNTER — Other Ambulatory Visit: Payer: Self-pay

## 2022-01-22 VITALS — BP 117/70 | HR 54 | Wt 150.0 lb

## 2022-01-22 DIAGNOSIS — Z3482 Encounter for supervision of other normal pregnancy, second trimester: Secondary | ICD-10-CM

## 2022-01-22 DIAGNOSIS — Z3A26 26 weeks gestation of pregnancy: Secondary | ICD-10-CM

## 2022-01-22 LAB — POCT URINALYSIS DIPSTICK OB
Bilirubin, UA: NEGATIVE
Blood, UA: NEGATIVE
Glucose, UA: NEGATIVE
Ketones, UA: NEGATIVE
Leukocytes, UA: NEGATIVE
Nitrite, UA: NEGATIVE
POC,PROTEIN,UA: NEGATIVE
Spec Grav, UA: 1.015 (ref 1.010–1.025)
Urobilinogen, UA: NEGATIVE E.U./dL — AB
pH, UA: 7 (ref 5.0–8.0)

## 2022-01-22 NOTE — Progress Notes (Signed)
ROB at [redacted]w[redacted]d. Good fetal movement. Feeling much better since recovering from covid. Having less incontinence. Received call from pelvic PT but has not scheduled yet. Discussed TDaP, GTT, and lab work at next visit. Already scheduled for 2/20.  [name], CNM

## 2022-01-28 ENCOUNTER — Encounter: Payer: Self-pay | Admitting: Certified Nurse Midwife

## 2022-02-01 ENCOUNTER — Encounter: Payer: Self-pay | Admitting: Certified Nurse Midwife

## 2022-02-01 ENCOUNTER — Telehealth: Payer: Self-pay | Admitting: Certified Nurse Midwife

## 2022-02-01 NOTE — Telephone Encounter (Signed)
Pt called stating that she started having intermittent pain last night  that she describes as needle like- rates 7/10 on pain scale. Pt is 28 weeks, states she can feel baby move, no bleeding

## 2022-02-01 NOTE — Telephone Encounter (Signed)
Message relayed to provider

## 2022-02-05 ENCOUNTER — Other Ambulatory Visit: Payer: Self-pay

## 2022-02-05 DIAGNOSIS — Z131 Encounter for screening for diabetes mellitus: Secondary | ICD-10-CM

## 2022-02-08 ENCOUNTER — Other Ambulatory Visit: Payer: BC Managed Care – PPO

## 2022-02-08 ENCOUNTER — Ambulatory Visit (INDEPENDENT_AMBULATORY_CARE_PROVIDER_SITE_OTHER): Payer: BC Managed Care – PPO | Admitting: Certified Nurse Midwife

## 2022-02-08 ENCOUNTER — Other Ambulatory Visit: Payer: Self-pay

## 2022-02-08 ENCOUNTER — Encounter: Payer: Self-pay | Admitting: Certified Nurse Midwife

## 2022-02-08 VITALS — BP 104/62 | HR 59 | Wt 153.0 lb

## 2022-02-08 DIAGNOSIS — Z23 Encounter for immunization: Secondary | ICD-10-CM

## 2022-02-08 DIAGNOSIS — Z3483 Encounter for supervision of other normal pregnancy, third trimester: Secondary | ICD-10-CM

## 2022-02-08 DIAGNOSIS — Z3A28 28 weeks gestation of pregnancy: Secondary | ICD-10-CM

## 2022-02-08 LAB — POCT URINALYSIS DIPSTICK OB
Bilirubin, UA: NEGATIVE
Blood, UA: NEGATIVE
Glucose, UA: NEGATIVE
Ketones, UA: NEGATIVE
Nitrite, UA: NEGATIVE
Spec Grav, UA: 1.015
Urobilinogen, UA: 0.2 U/dL
pH, UA: 7

## 2022-02-08 MED ORDER — TETANUS-DIPHTH-ACELL PERTUSSIS 5-2.5-18.5 LF-MCG/0.5 IM SUSY: 0.5000 mL | PREFILLED_SYRINGE | Freq: Once | INTRAMUSCULAR | Status: DC

## 2022-02-08 NOTE — Progress Notes (Signed)
ROB: Patient is doing well. No new concerns. She had her 1 hour glucose, tdap and signed her BTC form today.

## 2022-02-08 NOTE — Progress Notes (Signed)
ROB doing well. Feels good movement. 28 wk labs today: Glucose screen/RPR/CBC. Tdapgiven, Blood transfusion consent completed, all questions answered. Ready set baby reviewed, see check list for topics covered. Sample birth plan given, will follow up in upcoming visits. Discussed birth control after delivery, information pamphlet given. Pt plans BTL. Consent reviewed and signed today.   Follow up 2 wk with Missy for ROB or sooner if needed.    Philip Aspen, CNM

## 2022-02-08 NOTE — Patient Instructions (Signed)
Td (Tetanus, Diphtheria) Vaccine: What You Need to Know 1. Why get vaccinated? Td vaccine can prevent tetanus and diphtheria. Tetanus enters the body through cuts or wounds. Diphtheria spreads from person to person. TETANUS (T) causes painful stiffening of the muscles. Tetanus can lead to serious health problems, including being unable to open the mouth, having trouble swallowing and breathing, or death. DIPHTHERIA (D) can lead to difficulty breathing, heart failure, paralysis, or death. 2. Td vaccine Td is only for children 7 years and older, adolescents, and adults.  Td is usually given as a booster dose every 10 years, or after 5 years in the case of a severe or dirty wound or burn. Another vaccine, called "Tdap," may be used instead of Td. Tdap protects against pertussis, also known as "whooping cough," in addition to tetanus anddiphtheria. Td may be given at the same time as other vaccines. 3. Talk with your health care provider Tell your vaccination provider if the person getting the vaccine: Has had an allergic reaction after a previous dose of any vaccine that protects against tetanus or diphtheria, or has any severe, life-threatening allergies Has ever had Guillain-Barr Syndrome (also called "GBS") Has had severe pain or swelling after a previous dose of any vaccine that protects against tetanus or diphtheria In some cases, your health care provider may decide to postpone Td vaccinationuntil a future visit. People with minor illnesses, such as a cold, may be vaccinated. People who are moderately or severely ill should usually wait until they recover beforegetting Td vaccine.  Your health care provider can give you more information. 4. Risks of a vaccine reaction Pain, redness, or swelling where the shot was given, mild fever, headache, feeling tired, and nausea, vomiting, diarrhea, or stomachache sometimes happen after Td vaccination. People sometimes faint after medical procedures,  including vaccination. Tellyour provider if you feel dizzy or have vision changes or ringing in the ears.  As with any medicine, there is a very remote chance of a vaccine causing asevere allergic reaction, other serious injury, or death. 5. What if there is a serious problem? An allergic reaction could occur after the vaccinated person leaves the clinic. If you see signs of a severe allergic reaction (hives, swelling of the face and throat, difficulty breathing, a fast heartbeat, dizziness, or weakness), call 9-1-1and get the person to the nearest hospital.  For other signs that concern you, call your health care provider.  Adverse reactions should be reported to the Vaccine Adverse Event Reporting System (VAERS). Your health care provider will usually file this report, or you can do it yourself. Visit the VAERS website at www.vaers.hhs.gov or call 1-800-822-7967. VAERS is only for reporting reactions, and VAERS staff members do not give medical advice. 6. The National Vaccine Injury Compensation Program The National Vaccine Injury Compensation Program (VICP) is a federal program that was created to compensate people who may have been injured by certain vaccines. Claims regarding alleged injury or death due to vaccination have a time limit for filing, which may be as short as two years. Visit the VICP website at www.hrsa.gov/vaccinecompensation or call 1-800-338-2382to learn about the program and about filing a claim. 7. How can I learn more? Ask your health care provider. Call your local or state health department. Visit the website of the Food and Drug Administration (FDA) for vaccine package inserts and additional information at www.fda.gov/vaccines-blood-biologics/vaccines. Contact the Centers for Disease Control and Prevention (CDC): Call 1-800-232-4636 (1-800-CDC-INFO) or Visit CDC's website at www.cdc.gov/vaccines. Vaccine Information Statement   Td (Tetanus, Diphtheria) Vaccine (07/25/2020) This  information is not intended to replace advice given to you by your health care provider. Make sure you discuss any questions you have with your healthcare provider. Document Revised: 09/11/2020 Document Reviewed: 09/11/2020 Elsevier Patient Education  2022 Elsevier Inc.  

## 2022-02-09 ENCOUNTER — Other Ambulatory Visit: Payer: Self-pay | Admitting: Certified Nurse Midwife

## 2022-02-09 ENCOUNTER — Encounter: Payer: Self-pay | Admitting: Certified Nurse Midwife

## 2022-02-09 LAB — RPR: RPR Ser Ql: NONREACTIVE

## 2022-02-09 LAB — CBC
Hematocrit: 32.9 % — ABNORMAL LOW (ref 34.0–46.6)
Hemoglobin: 11.2 g/dL (ref 11.1–15.9)
MCH: 30.9 pg (ref 26.6–33.0)
MCHC: 34 g/dL (ref 31.5–35.7)
MCV: 91 fL (ref 79–97)
Platelets: 259 10*3/uL (ref 150–450)
RBC: 3.62 x10E6/uL — ABNORMAL LOW (ref 3.77–5.28)
RDW: 12.7 % (ref 11.7–15.4)
WBC: 7.3 10*3/uL (ref 3.4–10.8)

## 2022-02-09 LAB — GLUCOSE, 1 HOUR GESTATIONAL: Gestational Diabetes Screen: 83 mg/dL (ref 70–139)

## 2022-02-09 MED ORDER — FERROUS SULFATE 324 (65 FE) MG PO TBEC: 1.0000 | DELAYED_RELEASE_TABLET | Freq: Two times a day (BID) | ORAL | 6 refills | Status: DC

## 2022-02-19 ENCOUNTER — Encounter: Payer: Self-pay | Admitting: Certified Nurse Midwife

## 2022-02-20 ENCOUNTER — Observation Stay: Payer: BC Managed Care – PPO

## 2022-02-20 ENCOUNTER — Encounter: Payer: Self-pay | Admitting: Obstetrics and Gynecology

## 2022-02-20 ENCOUNTER — Other Ambulatory Visit: Payer: Self-pay

## 2022-02-20 ENCOUNTER — Observation Stay
Admission: EM | Admit: 2022-02-20 | Discharge: 2022-02-20 | Disposition: A | Discharge status: Home / Self Care | Payer: BC Managed Care – PPO | Attending: Obstetrics and Gynecology | Admitting: Obstetrics and Gynecology

## 2022-02-20 DIAGNOSIS — Z3A30 30 weeks gestation of pregnancy: Secondary | ICD-10-CM | POA: Diagnosis not present

## 2022-02-20 DIAGNOSIS — O212 Late vomiting of pregnancy: Secondary | ICD-10-CM | POA: Diagnosis not present

## 2022-02-20 DIAGNOSIS — R1011 Right upper quadrant pain: Secondary | ICD-10-CM | POA: Diagnosis not present

## 2022-02-20 DIAGNOSIS — R195 Other fecal abnormalities: Secondary | ICD-10-CM | POA: Diagnosis not present

## 2022-02-20 DIAGNOSIS — O26893 Other specified pregnancy related conditions, third trimester: Principal | ICD-10-CM

## 2022-02-20 DIAGNOSIS — R1010 Upper abdominal pain, unspecified: Secondary | ICD-10-CM | POA: Insufficient documentation

## 2022-02-20 LAB — CBC WITH DIFFERENTIAL/PLATELET
Abs Immature Granulocytes: 0.06 10*3/uL (ref 0.00–0.07)
Basophils Absolute: 0 10*3/uL (ref 0.0–0.1)
Basophils Relative: 0 %
Eosinophils Absolute: 0 10*3/uL (ref 0.0–0.5)
Eosinophils Relative: 0 %
HCT: 35.1 % — ABNORMAL LOW (ref 36.0–46.0)
Hemoglobin: 12.3 g/dL (ref 12.0–15.0)
Immature Granulocytes: 0 %
Lymphocytes Relative: 9 %
Lymphs Abs: 1.3 10*3/uL (ref 0.7–4.0)
MCH: 31.2 pg (ref 26.0–34.0)
MCHC: 35 g/dL (ref 30.0–36.0)
MCV: 89.1 fL (ref 80.0–100.0)
Monocytes Absolute: 0.5 10*3/uL (ref 0.1–1.0)
Monocytes Relative: 4 %
Neutro Abs: 12.2 10*3/uL — ABNORMAL HIGH (ref 1.7–7.7)
Neutrophils Relative %: 87 %
Platelets: 238 10*3/uL (ref 150–400)
RBC: 3.94 MIL/uL (ref 3.87–5.11)
RDW: 13.1 % (ref 11.5–15.5)
WBC: 14.1 10*3/uL — ABNORMAL HIGH (ref 4.0–10.5)
nRBC: 0 % (ref 0.0–0.2)

## 2022-02-20 LAB — COMPREHENSIVE METABOLIC PANEL
ALT: 18 U/L (ref 0–44)
AST: 17 U/L (ref 15–41)
Albumin: 3.2 g/dL — ABNORMAL LOW (ref 3.5–5.0)
Alkaline Phosphatase: 88 U/L (ref 38–126)
Anion gap: 7 (ref 5–15)
BUN: 7 mg/dL (ref 6–20)
CO2: 18 mmol/L — ABNORMAL LOW (ref 22–32)
Calcium: 8.6 mg/dL — ABNORMAL LOW (ref 8.9–10.3)
Chloride: 109 mmol/L (ref 98–111)
Creatinine, Ser: 0.51 mg/dL (ref 0.44–1.00)
GFR, Estimated: >60 mL/min (ref >60)
Glucose, Bld: 86 mg/dL (ref 70–99)
Potassium: 3.4 mmol/L — ABNORMAL LOW (ref 3.5–5.1)
Sodium: 134 mmol/L — ABNORMAL LOW (ref 135–145)
Total Bilirubin: 0.5 mg/dL (ref 0.3–1.2)
Total Protein: 7 g/dL (ref 6.5–8.1)

## 2022-02-20 LAB — LIPASE, BLOOD: Lipase: 30 U/L (ref 11–51)

## 2022-02-20 MED ORDER — ONDANSETRON 4 MG PO TBDP
4.0000 mg | ORAL_TABLET | Freq: Four times a day (QID) | ORAL | Status: DC | PRN
  Administered 2022-02-20: 4 mg via ORAL
  Filled 2022-02-20: qty 1

## 2022-02-20 MED ORDER — ACETAMINOPHEN-CODEINE #3 300-30 MG PO TABS
1.0000 | ORAL_TABLET | ORAL | Status: DC | PRN
  Administered 2022-02-20: 1 via ORAL
  Filled 2022-02-20: qty 1
  Filled 2022-02-20: qty 2

## 2022-02-20 MED ORDER — FAMOTIDINE 40 MG/5ML PO SUSR
40.0000 mg | Freq: Every day | ORAL | Status: DC
  Administered 2022-02-20: 40 mg via ORAL
  Filled 2022-02-20: qty 5

## 2022-02-20 NOTE — OB Triage Note (Signed)
Pt c/o abdominal pain starting @ 0915 this am. Reports + fetal movement. Denies LOF, vaginal bleeding. Rates pain 8/10. Pain located across abdomen above umbilicus. Milan Clare, Tresa Moore S ? ?

## 2022-02-20 NOTE — Final Progress Note (Signed)
L&D OB Triage Note ? ?Cindy Benson is a 26 y.o. G59P1011 female at [redacted]w[redacted]d, EDD Estimated Date of Delivery: 04/29/22 who presented to triage for complaints of upper abdominal pain that began at 0915 this morning. Has been having the pain on and offi for the past 7 days. She denies any alleviating or aggravating symptoms. Pain is 8/10, radiates across her upper abdomen.  Denies fevers, chills, vaginal bleeding, LOF, contractions, and notes good fetal movement.  Has been experiencing nausea/vomiting since this morning (vomited strawberries) and has noted loose stools this morning. She was evaluated by the nurses with no significant findings. Vital signs stable. An NST was performed and has been reviewed by MD. She was treated with PO Tylenol #3 (1 tab) and PO Pepcid.  ? ?Physical Exam:  ?Blood pressure (!) 108/51, pulse (!) 54, temperature 97.9 ?F (36.6 ?C), temperature source Oral, resp. rate 16, height 5\' 4"  (1.626 m), weight 69.4 kg, last menstrual period 07/23/2021. ?Cervical exam deferred.  ? ? ?NST INTERPRETATION: ?Indications: rule out uterine contractions ? ?Mode: External ?Baseline Rate (A): 125 bpm ?Variability: Moderate ?Accelerations: 15 x 15 ?Decelerations: None ?  ?  ?Contraction Frequency (min): ctx x 1 with ui ? ?Impression: reactive ? ? ?Labs:  ?Results for orders placed or performed during the hospital encounter of 02/20/22  ?OB RESULTS CONSOLE Varicella zoster antibody, IgG  ?Result Value Ref Range  ? Varicella Immune   ?CBC with Differential/Platelet  ?Result Value Ref Range  ? WBC 14.1 (H) 4.0 - 10.5 K/uL  ? RBC 3.94 3.87 - 5.11 MIL/uL  ? Hemoglobin 12.3 12.0 - 15.0 g/dL  ? HCT 35.1 (L) 36.0 - 46.0 %  ? MCV 89.1 80.0 - 100.0 fL  ? MCH 31.2 26.0 - 34.0 pg  ? MCHC 35.0 30.0 - 36.0 g/dL  ? RDW 13.1 11.5 - 15.5 %  ? Platelets 238 150 - 400 K/uL  ? nRBC 0.0 0.0 - 0.2 %  ? Neutrophils Relative % 87 %  ? Neutro Abs 12.2 (H) 1.7 - 7.7 K/uL  ? Lymphocytes Relative 9 %  ? Lymphs Abs 1.3 0.7 - 4.0 K/uL  ?  Monocytes Relative 4 %  ? Monocytes Absolute 0.5 0.1 - 1.0 K/uL  ? Eosinophils Relative 0 %  ? Eosinophils Absolute 0.0 0.0 - 0.5 K/uL  ? Basophils Relative 0 %  ? Basophils Absolute 0.0 0.0 - 0.1 K/uL  ? Immature Granulocytes 0 %  ? Abs Immature Granulocytes 0.06 0.00 - 0.07 K/uL  ?Comprehensive metabolic panel  ?Result Value Ref Range  ? Sodium 134 (L) 135 - 145 mmol/L  ? Potassium 3.4 (L) 3.5 - 5.1 mmol/L  ? Chloride 109 98 - 111 mmol/L  ? CO2 18 (L) 22 - 32 mmol/L  ? Glucose, Bld 86 70 - 99 mg/dL  ? BUN 7 6 - 20 mg/dL  ? Creatinine, Ser 0.51 0.44 - 1.00 mg/dL  ? Calcium 8.6 (L) 8.9 - 10.3 mg/dL  ? Total Protein 7.0 6.5 - 8.1 g/dL  ? Albumin 3.2 (L) 3.5 - 5.0 g/dL  ? AST 17 15 - 41 U/L  ? ALT 18 0 - 44 U/L  ? Alkaline Phosphatase 88 38 - 126 U/L  ? Total Bilirubin 0.5 0.3 - 1.2 mg/dL  ? GFR, Estimated >60 >60 mL/min  ? Anion gap 7 5 - 15  ?Lipase, blood  ?Result Value Ref Range  ? Lipase 30 11 - 51 U/L  ? ? ? ? ?Imaging:  ?04/22/22 ABDOMEN  LIMITED RUQ (LIVER/GB) ?CLINICAL DATA:  Right upper quadrant pain for 7 days. ? ?EXAM: ?ULTRASOUND ABDOMEN LIMITED RIGHT UPPER QUADRANT ? ?COMPARISON:  None. ? ?FINDINGS: ?Gallbladder: ? ?No gallstones or wall thickening visualized. No sonographic Eulah Pont ?sign noted by sonographer. ? ?Common bile duct: ? ?Diameter: 2 mm ? ?Liver: ? ?No focal lesion identified. Within normal limits in parenchymal ?echogenicity. Portal vein is patent on color Doppler imaging with ?normal direction of blood flow towards the liver. ? ?Other: None. ? ?IMPRESSION: ?Normal right upper quadrant ultrasound. ? ?Electronically Signed ?  By: Amie Portland M.D. ?  On: 02/20/2022 12:54 ? ? ?Plan: NST performed was reviewed and was found to be reactive. Labs and  imaging negative for gallbladder or pancreatic issues.  Patient's pain responded well to above PO meds. She was discharged home.  Continue routine prenatal care. Follow up with OB/GYN as previously scheduled.  ? ? ? ?Hildred Laser, MD ?[facility] ? ?

## 2022-02-20 NOTE — Progress Notes (Signed)
Discharge home. Left floor ambulatory by self. Valon Glasscock S  

## 2022-02-25 ENCOUNTER — Encounter: Payer: BC Managed Care – PPO | Admitting: Obstetrics

## 2022-02-26 ENCOUNTER — Ambulatory Visit (INDEPENDENT_AMBULATORY_CARE_PROVIDER_SITE_OTHER): Payer: BC Managed Care – PPO | Admitting: Obstetrics

## 2022-02-26 ENCOUNTER — Other Ambulatory Visit: Payer: Self-pay

## 2022-02-26 VITALS — BP 108/76 | HR 66 | Wt 152.4 lb

## 2022-02-26 DIAGNOSIS — Z3483 Encounter for supervision of other normal pregnancy, third trimester: Secondary | ICD-10-CM | POA: Diagnosis not present

## 2022-02-26 DIAGNOSIS — Z3A31 31 weeks gestation of pregnancy: Secondary | ICD-10-CM

## 2022-02-26 LAB — POCT URINALYSIS DIPSTICK OB
Bilirubin, UA: NEGATIVE
Blood, UA: NEGATIVE
Glucose, UA: NEGATIVE
Ketones, UA: NEGATIVE
Leukocytes, UA: NEGATIVE
Nitrite, UA: NEGATIVE
POC,PROTEIN,UA: NEGATIVE
Spec Grav, UA: 1.015 (ref 1.010–1.025)
Urobilinogen, UA: 0.2 E.U./dL
pH, UA: 6.5 (ref 5.0–8.0)

## 2022-02-26 NOTE — Progress Notes (Signed)
ROB at [redacted]w[redacted]d. Good fetal movement. Cindy Benson was in the ED recently due to pain,vomiting, and syncopal episode. She is feeling much better. Her appetite is very poor and she has trouble eating and keeping down her prenatal vitamin. She has lost six pounds this pregnancy. Recommended protein shakes, chewable vitamins, growth [place]. FHR 110-120 with doppler. RNST (see note). ROB in 2 weeks. ? ?[name], CNM ?

## 2022-03-15 ENCOUNTER — Ambulatory Visit (INDEPENDENT_AMBULATORY_CARE_PROVIDER_SITE_OTHER): Payer: BC Managed Care – PPO

## 2022-03-15 ENCOUNTER — Ambulatory Visit (INDEPENDENT_AMBULATORY_CARE_PROVIDER_SITE_OTHER): Payer: BC Managed Care – PPO | Admitting: Certified Nurse Midwife

## 2022-03-15 ENCOUNTER — Other Ambulatory Visit: Payer: Self-pay

## 2022-03-15 VITALS — BP 101/65 | HR 52 | Wt 153.6 lb

## 2022-03-15 DIAGNOSIS — Z3A33 33 weeks gestation of pregnancy: Secondary | ICD-10-CM

## 2022-03-15 DIAGNOSIS — Z3483 Encounter for supervision of other normal pregnancy, third trimester: Secondary | ICD-10-CM

## 2022-03-15 LAB — POCT URINALYSIS DIPSTICK OB
Bilirubin, UA: NEGATIVE
Blood, UA: NEGATIVE
Glucose, UA: NEGATIVE
Ketones, UA: NEGATIVE
Leukocytes, UA: NEGATIVE
Nitrite, UA: NEGATIVE
POC,PROTEIN,UA: NEGATIVE
Spec Grav, UA: 1.02 (ref 1.010–1.025)
Urobilinogen, UA: 0.2 U/dL
pH, UA: 6 (ref 5.0–8.0)

## 2022-03-15 NOTE — Progress Notes (Signed)
ROB and u/s today for growth due to weight loss in pregnancy.  See below, results reviewed. Reassurance given. Discussed GBS next visit with Missy. Reviewed practice merger . Pt verbalizes understanding.  ? ?Doreene Burke, CNM  ? ? ?Patient Name: Cindy Benson ?DOB: [birth year] ?MRN: [id number] ? ?ULTRASOUND REPORT ? ?Location: [facility] ?Date of Service: 03/15/2022  ? ?Indications:growth/afi ?Findings:  ?Singleton intrauterine pregnancy is visualized with FHR at 123 BPM.  ? ?Biometrics give an (U/S) Gestational age of [redacted]w[redacted]d and an (U/S) EDD of 04/16/22; this correlates with the clinically established Estimated Date of Delivery: 04/29/22.  ? ?Fetal presentation is Cephalic.  ?Placenta: anterior. Grade: 1 ?AFI: 10 cm ? ?Growth percentile is 61.  AC percentile is 77. ?EFW: 2527g / 5lb9oz. ? ?Impression: ?1. [redacted]w[redacted]d Viable Singleton Intrauterine pregnancy previously established criteria. ?2. Growth is 61 %ile.  AFI is 10 cm.  ? ?Recommendations: ?1.Clinical correlation with the patient's History and Physical Exam. ? ?Sheralyn Boatman  [place] ?

## 2022-03-25 ENCOUNTER — Ambulatory Visit (INDEPENDENT_AMBULATORY_CARE_PROVIDER_SITE_OTHER): Payer: BC Managed Care – PPO | Admitting: Obstetrics

## 2022-03-25 ENCOUNTER — Encounter: Payer: Self-pay | Admitting: Obstetrics

## 2022-03-25 VITALS — BP 128/80 | HR 82 | Wt 154.6 lb

## 2022-03-25 DIAGNOSIS — Z3483 Encounter for supervision of other normal pregnancy, third trimester: Secondary | ICD-10-CM

## 2022-03-25 DIAGNOSIS — R399 Unspecified symptoms and signs involving the genitourinary system: Secondary | ICD-10-CM | POA: Diagnosis not present

## 2022-03-25 LAB — FETAL NONSTRESS TEST

## 2022-03-25 LAB — POCT URINALYSIS DIPSTICK OB
Blood, UA: NEGATIVE
Glucose, UA: NEGATIVE
Ketones, UA: NEGATIVE
Nitrite, UA: NEGATIVE
Spec Grav, UA: 1.015 (ref 1.010–1.025)
Urobilinogen, UA: 0.2 E.U./dL
pH, UA: 7 (ref 5.0–8.0)

## 2022-03-25 NOTE — Progress Notes (Signed)
ROB at [redacted]w[redacted]d. Active baby. Having round ligament pain and  a lot of leg cramps at night. Discussed comfort measures. FHR 108 on Doppler. NST done (see note). Reactive. Baseline 105-110. Reviewed with Dr. Valentino Saxon. Encouraged daily fetal kick counts. RTC in one week. GBS and GC/chlamydia at next visit. ? ?Cindy Benson, CNM ?

## 2022-03-26 LAB — FETAL NONSTRESS TEST

## 2022-03-30 ENCOUNTER — Encounter: Payer: Self-pay | Admitting: Certified Nurse Midwife

## 2022-03-30 ENCOUNTER — Ambulatory Visit (INDEPENDENT_AMBULATORY_CARE_PROVIDER_SITE_OTHER): Payer: BC Managed Care – PPO | Admitting: Certified Nurse Midwife

## 2022-03-30 ENCOUNTER — Encounter: Payer: BC Managed Care – PPO | Admitting: Certified Nurse Midwife

## 2022-03-30 VITALS — BP 127/78 | HR 65 | Wt 158.0 lb

## 2022-03-30 DIAGNOSIS — O368330 Maternal care for abnormalities of the fetal heart rate or rhythm, third trimester, not applicable or unspecified: Secondary | ICD-10-CM

## 2022-03-30 DIAGNOSIS — Z3483 Encounter for supervision of other normal pregnancy, third trimester: Secondary | ICD-10-CM

## 2022-03-30 DIAGNOSIS — Z3A35 35 weeks gestation of pregnancy: Secondary | ICD-10-CM

## 2022-03-30 DIAGNOSIS — O368390 Maternal care for abnormalities of the fetal heart rate or rhythm, unspecified trimester, not applicable or unspecified: Secondary | ICD-10-CM

## 2022-03-30 LAB — POCT URINALYSIS DIPSTICK OB
Bilirubin, UA: NEGATIVE
Blood, UA: NEGATIVE
Glucose, UA: NEGATIVE
Ketones, UA: NEGATIVE
Leukocytes, UA: NEGATIVE
Nitrite, UA: NEGATIVE
Spec Grav, UA: 1.015 (ref 1.010–1.025)
Urobilinogen, UA: 0.2 E.U./dL
pH, UA: 6.5 (ref 5.0–8.0)

## 2022-03-30 LAB — FETAL NONSTRESS TEST

## 2022-03-30 LAB — URINE CULTURE

## 2022-03-30 NOTE — Addendum Note (Signed)
Addended by: Lady Deutscher on: 03/30/2022 11:15 AM ? ? Modules accepted: Orders ? ?

## 2022-03-30 NOTE — Patient Instructions (Signed)

## 2022-03-30 NOTE — Progress Notes (Signed)
ROB & NST. Pt doing well, feeling lots of movement. NST repeated today due to having low baseline last week. Herbal prep handout given. GBS and cultures today. Discussed PTL precautions. Follow up 1 wk with Missy for ROB.  ? ?NST: reactive ?Baseline 115 ?Accelerations: present ?Decelerations absent ?Variability: moderate ?Contractions:absent  ?

## 2022-04-01 LAB — STREP GP B NAA: Strep Gp B NAA: NEGATIVE

## 2022-04-01 LAB — GC/CHLAMYDIA PROBE AMP
Chlamydia trachomatis, NAA: NEGATIVE
Neisseria Gonorrhoeae by PCR: NEGATIVE

## 2022-04-04 ENCOUNTER — Encounter: Payer: Self-pay | Admitting: Obstetrics

## 2022-04-04 ENCOUNTER — Other Ambulatory Visit: Payer: Self-pay | Admitting: Obstetrics

## 2022-04-04 MED ORDER — NITROFURANTOIN MONOHYD MACRO 100 MG PO CAPS: 100.0000 mg | ORAL_CAPSULE | Freq: Two times a day (BID) | ORAL | 0 refills | Status: DC

## 2022-04-07 ENCOUNTER — Ambulatory Visit (INDEPENDENT_AMBULATORY_CARE_PROVIDER_SITE_OTHER): Payer: BC Managed Care – PPO | Admitting: Obstetrics

## 2022-04-07 VITALS — BP 123/73 | HR 54 | Wt 158.0 lb

## 2022-04-07 DIAGNOSIS — Z3483 Encounter for supervision of other normal pregnancy, third trimester: Secondary | ICD-10-CM | POA: Diagnosis not present

## 2022-04-07 DIAGNOSIS — Z3A36 36 weeks gestation of pregnancy: Secondary | ICD-10-CM

## 2022-04-07 LAB — POCT URINALYSIS DIPSTICK OB
Glucose, UA: NEGATIVE
POC,PROTEIN,UA: NEGATIVE

## 2022-04-07 LAB — FETAL NONSTRESS TEST

## 2022-04-07 NOTE — Progress Notes (Signed)
ROB at [redacted]w[redacted]d. Active baby. Feeling occasional contractions. NST for low baseline. Baseline 100, + accels, no decels, reactive. Reviewed with Dr. Logan Bores due to low baseline. Recommend weekly NSTs and a BPP this week if possible. MFM consult. Reviewed kick counts. Discussed encouraging labor, when to come to the hospital. Reviewed birth plan. ROB in one week. BPP later this week (will schedule).  ? ?Cindy Benson Spanish, CNM ?

## 2022-04-08 ENCOUNTER — Ambulatory Visit: Payer: BC Managed Care – PPO | Attending: Obstetrics

## 2022-04-08 ENCOUNTER — Other Ambulatory Visit: Payer: Self-pay | Admitting: Obstetrics

## 2022-04-08 ENCOUNTER — Ambulatory Visit (HOSPITAL_BASED_OUTPATIENT_CLINIC_OR_DEPARTMENT_OTHER): Payer: BC Managed Care – PPO | Admitting: Obstetrics

## 2022-04-08 ENCOUNTER — Other Ambulatory Visit: Payer: Self-pay

## 2022-04-08 DIAGNOSIS — Z3483 Encounter for supervision of other normal pregnancy, third trimester: Secondary | ICD-10-CM

## 2022-04-08 DIAGNOSIS — O359XX0 Maternal care for (suspected) fetal abnormality and damage, unspecified, not applicable or unspecified: Secondary | ICD-10-CM | POA: Diagnosis not present

## 2022-04-08 DIAGNOSIS — O283 Abnormal ultrasonic finding on antenatal screening of mother: Secondary | ICD-10-CM | POA: Diagnosis not present

## 2022-04-08 DIAGNOSIS — O368390 Maternal care for abnormalities of the fetal heart rate or rhythm, unspecified trimester, not applicable or unspecified: Secondary | ICD-10-CM | POA: Diagnosis not present

## 2022-04-08 DIAGNOSIS — O368330 Maternal care for abnormalities of the fetal heart rate or rhythm, third trimester, not applicable or unspecified: Secondary | ICD-10-CM

## 2022-04-08 DIAGNOSIS — D649 Anemia, unspecified: Secondary | ICD-10-CM | POA: Diagnosis not present

## 2022-04-08 DIAGNOSIS — O99013 Anemia complicating pregnancy, third trimester: Secondary | ICD-10-CM | POA: Insufficient documentation

## 2022-04-08 DIAGNOSIS — Z3A37 37 weeks gestation of pregnancy: Secondary | ICD-10-CM

## 2022-04-08 DIAGNOSIS — Z8616 Personal history of COVID-19: Secondary | ICD-10-CM | POA: Diagnosis not present

## 2022-04-08 DIAGNOSIS — Z363 Encounter for antenatal screening for malformations: Secondary | ICD-10-CM | POA: Diagnosis not present

## 2022-04-08 DIAGNOSIS — O09293 Supervision of pregnancy with other poor reproductive or obstetric history, third trimester: Secondary | ICD-10-CM | POA: Diagnosis not present

## 2022-04-09 NOTE — Progress Notes (Signed)
MFM Note ? ?Sahvannah Leckey was seen for an ultrasound exam today as a low baseline fetal heart rate in the low 100s range was noted during her prenatal visit a few days ago.  The patient denies any other problems in her current pregnancy and has screened negative for gestational diabetes.  She reports feeling fetal movements throughout the day. ? ?On today's exam, the overall EFW of 7 pounds 5 ounces measures at the 77th percentile for her gestational age.  There was normal amniotic fluid noted. ? ?A BPP performed today was 8 out of 8.   ? ?The fetal heart rate was noted to be in the 120s to 130s range throughout today's exam. ? ?There were no obvious fetal anomalies noted on today's exam.  However, the views of the fetal anatomy were limited due to her advanced gestational age. ? ?Due to the low baseline fetal heart rate noted a few days ago, the patient should have another NST performed in their office next week.   ? ?Should a low baseline be noted again during her NST next week, delivery should be considered at that time.   ? ?Should the fetal heart rate be within normal limits during her next NST, delivery may be considered at 39 weeks or greater.  She should continue weekly NSTs until delivery. ? ?Fetal kick count instructions were reviewed.   ? ?The patient stated that all of her questions have been answered. ? ?A total of 30 minutes was spent counseling and coordinating the care for this patient.  Greater than 50% of the time was spent in direct face-to-face contact. ?

## 2022-04-12 ENCOUNTER — Inpatient Hospital Stay: Payer: BC Managed Care – PPO | Admitting: Anesthesiology

## 2022-04-12 ENCOUNTER — Inpatient Hospital Stay
Admission: EM | Admit: 2022-04-12 | Discharge: 2022-04-14 | DRG: 807 | Disposition: A | Discharge status: Home / Self Care | Payer: BC Managed Care – PPO | Attending: Obstetrics | Admitting: Obstetrics

## 2022-04-12 ENCOUNTER — Encounter: Payer: Self-pay | Admitting: Obstetrics

## 2022-04-12 ENCOUNTER — Other Ambulatory Visit: Payer: Self-pay

## 2022-04-12 DIAGNOSIS — Z3A37 37 weeks gestation of pregnancy: Secondary | ICD-10-CM | POA: Diagnosis not present

## 2022-04-12 DIAGNOSIS — O9081 Anemia of the puerperium: Secondary | ICD-10-CM | POA: Diagnosis not present

## 2022-04-12 DIAGNOSIS — O26893 Other specified pregnancy related conditions, third trimester: Secondary | ICD-10-CM | POA: Diagnosis not present

## 2022-04-12 DIAGNOSIS — R109 Unspecified abdominal pain: Secondary | ICD-10-CM | POA: Diagnosis not present

## 2022-04-12 LAB — CBC
HCT: 34 % — ABNORMAL LOW (ref 36.0–46.0)
Hemoglobin: 11.7 g/dL — ABNORMAL LOW (ref 12.0–15.0)
MCH: 31 pg (ref 26.0–34.0)
MCHC: 34.4 g/dL (ref 30.0–36.0)
MCV: 90.2 fL (ref 80.0–100.0)
Platelets: 257 10*3/uL (ref 150–400)
RBC: 3.77 MIL/uL — ABNORMAL LOW (ref 3.87–5.11)
RDW: 12.8 % (ref 11.5–15.5)
WBC: 9.2 10*3/uL (ref 4.0–10.5)
nRBC: 0 % (ref 0.0–0.2)

## 2022-04-12 MED ORDER — FENTANYL-BUPIVACAINE-NACL 0.5-0.125-0.9 MG/250ML-% EP SOLN
12.0000 mL/h | EPIDURAL | Status: DC | PRN
  Administered 2022-04-12: 12 mL/h via EPIDURAL
  Filled 2022-04-12: qty 250

## 2022-04-12 MED ORDER — OXYTOCIN-SODIUM CHLORIDE 30-0.9 UT/500ML-% IV SOLN
2.5000 [IU]/h | INTRAVENOUS | Status: DC
  Administered 2022-04-13: 2.5 [IU]/h via INTRAVENOUS
  Filled 2022-04-12: qty 500

## 2022-04-12 MED ORDER — PHENYLEPHRINE 80 MCG/ML (10ML) SYRINGE FOR IV PUSH (FOR BLOOD PRESSURE SUPPORT): 80.0000 ug | PREFILLED_SYRINGE | INTRAVENOUS | Status: DC | PRN

## 2022-04-12 MED ORDER — DIPHENHYDRAMINE HCL 50 MG/ML IJ SOLN: 12.5000 mg | INTRAMUSCULAR | Status: DC | PRN

## 2022-04-12 MED ORDER — ACETAMINOPHEN 325 MG PO TABS: 650.0000 mg | ORAL_TABLET | ORAL | Status: DC | PRN

## 2022-04-12 MED ORDER — EPHEDRINE 5 MG/ML INJ: 10.0000 mg | INTRAVENOUS | Status: DC | PRN

## 2022-04-12 MED ORDER — OXYTOCIN BOLUS FROM INFUSION
333.0000 mL | Freq: Once | INTRAVENOUS | Status: AC
  Administered 2022-04-13: 333 mL via INTRAVENOUS

## 2022-04-12 MED ORDER — BUTORPHANOL TARTRATE 1 MG/ML IJ SOLN: 1.0000 mg | INTRAMUSCULAR | Status: DC | PRN

## 2022-04-12 MED ORDER — ONDANSETRON HCL 4 MG/2ML IJ SOLN: 4.0000 mg | Freq: Four times a day (QID) | INTRAMUSCULAR | Status: DC | PRN

## 2022-04-12 MED ORDER — HYDROXYZINE HCL 50 MG PO TABS
50.0000 mg | ORAL_TABLET | Freq: Four times a day (QID) | ORAL | Status: DC | PRN
  Administered 2022-04-12: 50 mg via ORAL
  Filled 2022-04-12 (×2): qty 1

## 2022-04-12 MED ORDER — LACTATED RINGERS IV SOLN: INTRAVENOUS | Status: DC

## 2022-04-12 MED ORDER — LACTATED RINGERS IV SOLN
500.0000 mL | Freq: Once | INTRAVENOUS | Status: AC
Start: 2022-04-13 — End: 2022-04-12
  Administered 2022-04-12: 500 mL via INTRAVENOUS

## 2022-04-12 MED ORDER — LIDOCAINE HCL (PF) 1 % IJ SOLN: 30.0000 mL | INTRAMUSCULAR | Status: DC | PRN

## 2022-04-12 MED ORDER — SOD CITRATE-CITRIC ACID 500-334 MG/5ML PO SOLN: 30.0000 mL | ORAL | Status: DC | PRN

## 2022-04-12 MED ORDER — LACTATED RINGERS IV SOLN
500.0000 mL | INTRAVENOUS | Status: DC | PRN
  Administered 2022-04-12: 1000 mL via INTRAVENOUS

## 2022-04-12 NOTE — H&P (Signed)
?History and Physical ? ? ?HPI ? ?Cindy Benson is a 26 y.o. G3P1011 at [redacted]w[redacted]d Estimated Date of Delivery: 04/29/22 who is being admitted for labor management. She reports contractions q 2 min for the past few hours. She denies LOF and vaginal  bleeding. She endorses good fetal movement. Prenatal care was notable for a low baseline FHR, and she was evaluated by MFM. ? ? ?OB History ? ?OB History  ?Gravida Para Term Preterm AB Living  ?3 1 1  0 1 1  ?SAB IAB Ectopic Multiple Live Births  ?1 0 0 0 0  ?  ?# Outcome Date GA Lbr Len/2nd Weight Sex Delivery Anes PTL Lv  ?3 Current           ?2 Term           ?1 SAB           ? ? ?PROBLEM LIST ? ?Pregnancy complications or risks: ?Patient Active Problem List  ? Diagnosis Date Noted  ? Labor and delivery, indication for care 04/12/2022  ? RUQ pain 02/20/2022  ? [redacted] weeks gestation of pregnancy   ? Indication for care in labor or delivery 12/10/2021  ? ? ?Prenatal labs and studies: ?ABO, Rh: O/Positive/-- 2023/10/31 1439) ?Antibody: Negative 10-31-2023 1439) ?Rubella: 8.27 Oct 31, 2023 1439) ?RPR: Non Reactive (02/20 0927)  ?HBsAg: Negative October 31, 2023 1439)  ?HIV: Non Reactive 10/31/23 1439)  ?[id number] (04/11 1122) ? ? ?Past Medical History:  ?Diagnosis Date  ? Anxiety   ? ? ? ?Past Surgical History:  ?Procedure Laterality Date  ? ECTOPIC PREGNANCY SURGERY Right   ? KNEE SURGERY Right   ? ? ? ?Medications   ? ?Current Discharge Medication List  ?  ? ?CONTINUE these medications which have NOT CHANGED  ? Details  ?ferrous sulfate 324 (65 Fe) MG TBEC Take 1 tablet (325 mg total) by mouth 2 (two) times daily at 8 am and 10 pm. ?Qty: 30 tablet, Refills: 6  ?  ?nitrofurantoin, macrocrystal-monohydrate, (MACROBID) 100 MG capsule Take 1 capsule (100 mg total) by mouth 2 (two) times daily. ?Qty: 14 capsule, Refills: 0  ?  ?Prenatal Vit-Fe Fumarate-FA (PRENATAL MULTIVITAMIN) TABS tablet Take 1 tablet by mouth daily at 12 noon.  ?  ?  ? ? ? ?Allergies ? ?Ibuprofen ? ?Review of  Systems ? ?Pertinent items are noted in HPI. ? ?Physical Exam ? ?LMP 07/23/2021 (Exact Date)  ? ?Lungs:  CTA B ?Cardio: RRR without M/R/G ?Abd: Soft, gravid, NT ?Presentation: cephalic ? ?CERVIX: Dilation: 4.5 ?Effacement (%): 50 ?Cervical Position: Anterior ?Station: -3 ?Presentation: Vertex ?Exam by:: Maryfrances Bunnell, RN ? ?See Prenatal records for more detailed PE. ? ?  ?FHR:  ?Baseline: 110 ?Variability: moderate ?Accelerations: present, 15x15 ?Decelerations: none ?Toco: regular, every 2-3 minutes ?Category 1 ? ?Test Results ? ?No results found for this or any previous visit (from the past 24 hour(s)). ?Group B Strep negative ? ?Assessment ? ? [id number] at [redacted]w[redacted]d Estimated Date of Delivery: 04/29/22  ?Reassuring maternal/fetal status. ? ?Patient Active Problem List  ? Diagnosis Date Noted  ? Labor and delivery, indication for care 04/12/2022  ? RUQ pain 02/20/2022  ? [redacted] weeks gestation of pregnancy   ? Indication for care in labor or delivery 12/10/2021  ? ? ?Plan ? ?1. Admit to L&D for birth ?2. EFM: Continuous - Category 1 ?3. Epidural as desired   ?4. Admission labs  ?5. Anticipate NSVD ?6. Dr. Amalia Hailey notified of admission ? ?Lloyd Huger, CNM ?04/12/2022 ?10:31 PM  ?

## 2022-04-12 NOTE — Anesthesia Preprocedure Evaluation (Addendum)
Anesthesia Evaluation  Patient identified by MRN, date of birth, ID band Patient awake    Reviewed: Allergy & Precautions, NPO status , Patient's Chart, lab work & pertinent test results  History of Anesthesia Complications Negative for: history of anesthetic complications  Airway Mallampati: III   Neck ROM: Full    Dental no notable dental hx.    Pulmonary neg pulmonary ROS,    Pulmonary exam normal breath sounds clear to auscultation       Cardiovascular Exercise Tolerance: Good negative cardio ROS Normal cardiovascular exam Rhythm:Regular Rate:Normal     Neuro/Psych negative neurological ROS     GI/Hepatic negative GI ROS,   Endo/Other  negative endocrine ROS  Renal/GU negative Renal ROS     Musculoskeletal   Abdominal   Peds  Hematology negative hematology ROS (+)   Anesthesia Other Findings   Reproductive/Obstetrics                            Anesthesia Physical Anesthesia Plan  ASA: 2  Anesthesia Plan: Epidural   Post-op Pain Management:    Induction:   PONV Risk Score and Plan: 2 and Treatment may vary due to age or medical condition  Airway Management Planned: Natural Airway  Additional Equipment:   Intra-op Plan:   Post-operative Plan:   Informed Consent: I have reviewed the patients History and Physical, chart, labs and discussed the procedure including the risks, benefits and alternatives for the proposed anesthesia with the patient or authorized representative who has indicated his/her understanding and acceptance.     Dental Advisory Given  Plan Discussed with:   Anesthesia Plan Comments: (Patient reports no bleeding problems and no anticoagulant use.   Patient consented for risks of anesthesia including but not limited to:  - adverse reactions to medications - risk of bleeding, infection and or nerve damage from epidural that could lead to  paralysis - risk of headache or failed epidural - nerve damage due to positioning - that if epidural is used for C-section that there is a chance of epidural failure requiring spinal placement or conversion to GA - Damage to heart, brain, lungs, other parts of body or loss of life  Patient voiced understanding.)        Anesthesia Quick Evaluation  

## 2022-04-13 DIAGNOSIS — Z3A37 37 weeks gestation of pregnancy: Secondary | ICD-10-CM

## 2022-04-13 LAB — CBC
HCT: 31.8 % — ABNORMAL LOW (ref 36.0–46.0)
Hemoglobin: 11.2 g/dL — ABNORMAL LOW (ref 12.0–15.0)
MCH: 32 pg (ref 26.0–34.0)
MCHC: 35.2 g/dL (ref 30.0–36.0)
MCV: 90.9 fL (ref 80.0–100.0)
Platelets: 227 10*3/uL (ref 150–400)
RBC: 3.5 MIL/uL — ABNORMAL LOW (ref 3.87–5.11)
RDW: 13.1 % (ref 11.5–15.5)
WBC: 16.5 10*3/uL — ABNORMAL HIGH (ref 4.0–10.5)
nRBC: 0 % (ref 0.0–0.2)

## 2022-04-13 LAB — RPR: RPR Ser Ql: NONREACTIVE

## 2022-04-13 LAB — TYPE AND SCREEN
ABO/RH(D): O POS
Antibody Screen: NEGATIVE

## 2022-04-13 LAB — ABO/RH: ABO/RH(D): O POS

## 2022-04-13 MED ORDER — DIPHENHYDRAMINE HCL 25 MG PO CAPS: 25.0000 mg | ORAL_CAPSULE | Freq: Four times a day (QID) | ORAL | Status: DC | PRN

## 2022-04-13 MED ORDER — BUPIVACAINE HCL (PF) 0.25 % IJ SOLN
INTRAMUSCULAR | Status: DC | PRN
  Administered 2022-04-12: 8 mL via EPIDURAL

## 2022-04-13 MED ORDER — SIMETHICONE 80 MG PO CHEW
80.0000 mg | CHEWABLE_TABLET | ORAL | Status: DC | PRN
Start: 2022-04-13 — End: 2022-04-14

## 2022-04-13 MED ORDER — ACETAMINOPHEN 325 MG PO TABS
650.0000 mg | ORAL_TABLET | ORAL | Status: DC | PRN
  Administered 2022-04-13 (×2): 650 mg via ORAL
  Administered 2022-04-13: 325 mg via ORAL
  Administered 2022-04-14: 650 mg via ORAL
  Filled 2022-04-13 (×4): qty 2

## 2022-04-13 MED ORDER — COCONUT OIL OIL
1.0000 "application " | TOPICAL_OIL | Status: DC | PRN
  Filled 2022-04-13: qty 120

## 2022-04-13 MED ORDER — METHYLERGONOVINE MALEATE 0.2 MG/ML IJ SOLN
0.2000 mg | INTRAMUSCULAR | Status: DC | PRN
  Filled 2022-04-13: qty 1

## 2022-04-13 MED ORDER — OXYTOCIN 10 UNIT/ML IJ SOLN
INTRAMUSCULAR | Status: AC
  Filled 2022-04-13: qty 2

## 2022-04-13 MED ORDER — OXYTOCIN-SODIUM CHLORIDE 30-0.9 UT/500ML-% IV SOLN: 2.5000 [IU]/h | INTRAVENOUS | Status: DC | PRN

## 2022-04-13 MED ORDER — PRENATAL MULTIVITAMIN CH
1.0000 | ORAL_TABLET | Freq: Every day | ORAL | Status: DC
  Administered 2022-04-13 – 2022-04-14 (×2): 1 via ORAL
  Filled 2022-04-13 (×2): qty 1

## 2022-04-13 MED ORDER — LIDOCAINE-EPINEPHRINE (PF) 1.5 %-1:200000 IJ SOLN
INTRAMUSCULAR | Status: DC | PRN
  Administered 2022-04-12: 3 mL via EPIDURAL

## 2022-04-13 MED ORDER — BENZOCAINE-MENTHOL 20-0.5 % EX AERO: 1.0000 "application " | INHALATION_SPRAY | CUTANEOUS | Status: DC | PRN

## 2022-04-13 MED ORDER — AMMONIA AROMATIC IN INHA
RESPIRATORY_TRACT | Status: AC
  Filled 2022-04-13: qty 10

## 2022-04-13 MED ORDER — DOCUSATE SODIUM 100 MG PO CAPS
100.0000 mg | ORAL_CAPSULE | Freq: Two times a day (BID) | ORAL | Status: DC
  Filled 2022-04-13: qty 1

## 2022-04-13 MED ORDER — MISOPROSTOL 200 MCG PO TABS
ORAL_TABLET | ORAL | Status: AC
  Filled 2022-04-13: qty 4

## 2022-04-13 MED ORDER — LIDOCAINE HCL (PF) 1 % IJ SOLN
INTRAMUSCULAR | Status: AC
  Filled 2022-04-13: qty 30

## 2022-04-13 MED ORDER — METHYLERGONOVINE MALEATE 0.2 MG PO TABS
0.2000 mg | ORAL_TABLET | ORAL | Status: DC | PRN
  Filled 2022-04-13: qty 1

## 2022-04-13 NOTE — Progress Notes (Signed)
? ?Subjective:  ?Having some back pain, using Tylenol but unable to use Motrin d/t an allergy. Ambulating and voiding without difficulty. Has a good appetite.  Bleeding WNL. Breastfeeding independently.  Discussed interval tubal and procedure is not able to be done today.  Agreeable to plan.  ? ?Objective:  ?Vital signs in last 24 hours: ?Temp:  [97.5 ?F (36.4 ?C)-98 ?F (36.7 ?C)] 98 ?F (36.7 ?C) (04/25 1142) ?Pulse Rate:  [47-93] 52 (04/25 1142) ?Resp:  [16-18] 18 (04/25 1142) ?BP: (97-131)/(47-102) 101/65 (04/25 1142) ?SpO2:  [98 %-100 %] 100 % (04/25 0720) ?Weight:  [76.2 kg] 76.2 kg (04/24 2255) ?  ? ?General: NAD ?Pulmonary: no increased work of breathing ?Breasts: soft, nipples erect and intact bilaterally.  ?Abdomen: non-distended, non-tender, fundus firm at level of umbilicus ?Perineum: well approximated, slightly edematous.  ?Extremities: no edema, no erythema, no tenderness ? ?Results for orders placed or performed during the hospital encounter of 04/12/22 (from the past 72 hour(s))  ?CBC     Status: Abnormal  ? Collection Time: 04/12/22 10:43 PM  ?Result Value Ref Range  ? WBC 9.2 4.0 - 10.5 K/uL  ? RBC 3.77 (L) 3.87 - 5.11 MIL/uL  ? Hemoglobin 11.7 (L) 12.0 - 15.0 g/dL  ? HCT 34.0 (L) 36.0 - 46.0 %  ? MCV 90.2 80.0 - 100.0 fL  ? MCH 31.0 26.0 - 34.0 pg  ? MCHC 34.4 30.0 - 36.0 g/dL  ? RDW 12.8 11.5 - 15.5 %  ? Platelets 257 150 - 400 K/uL  ? nRBC 0.0 0.0 - 0.2 %  ?  Comment: Performed at [facility], [address], [place], [place] [address]  ?Type and screen [facility]     Status: None  ? Collection Time: 04/12/22 10:43 PM  ?Result Value Ref Range  ? ABO/RH(D) O POS   ? Antibody Screen NEG   ? Sample Expiration    ?  04/15/2022,2359 ?Performed at [facility], [address], [place], [place] [address] ?  ?RPR     Status: None  ? Collection Time: 04/12/22 10:43 PM  ?Result Value Ref Range  ? RPR Ser Ql NON REACTIVE NON REACTIVE  ?  Comment: Performed at [facility], [address], [place], [place] [address]  ?ABO/Rh     Status: None  ? Collection Time: 04/13/22 12:07 AM  ?Result Value Ref Range  ? ABO/RH(D)    ?  O POS ?Performed at [facility], [address] [place], [place] [address] ?  ?CBC     Status: Abnormal  ? Collection Time: 04/13/22  5:32 AM  ?Result Value Ref Range  ? WBC 16.5 (H) 4.0 - 10.5 K/uL  ? RBC 3.50 (L) 3.87 - 5.11 MIL/uL  ? Hemoglobin 11.2 (L) 12.0 - 15.0 g/dL  ? HCT 31.8 (L) 36.0 - 46.0 %  ? MCV 90.9 80.0 - 100.0 fL  ? MCH 32.0 26.0 - 34.0 pg  ? MCHC 35.2 30.0 - 36.0 g/dL  ? RDW 13.1 11.5 - 15.5 %  ? Platelets 227 150 - 400 K/uL  ? nRBC 0.0 0.0 - 0.2 %  ?  Comment: Performed at [facility], [address], [place], [place] [address]  ? ? ?Assessment:  ? 26 y.o. G3P1011 postpartum day # 0 ? ?Plan:  ? ? ?1) Acute blood loss anemia - hemodynamically stable and asymptomatic ?- po ferrous sulfate ? ?2) Blood Type --/--/O POS ?Performed at [facility], [address] [place], [place] [address] ? (04/25 0007) / Ishmael Holter 8.27 (10/24 1439) / Varicella Immune ? ?3) TDAP status up to date ? ?4) Feeding plan breast ? ?5)  Education given regarding options for contraception, as well as compatibility with breast feeding if applicable.  Patient plans on tubal ligation for contraception. ? ?6) Disposition discharge 4/26.  Return to clinic in 3-4 weeks to arrange tubal ligation.  ? ?Carie Caddy, CNM  ?[facility], [facility] ?04/13/2022, 1:26 PM ? ?  ?

## 2022-04-13 NOTE — Anesthesia Procedure Notes (Signed)
Epidural ?Patient location during procedure: OB ?Start time: 04/12/2022 11:21 PM ?End time: 04/12/2022 11:31 PM ? ?Staffing ?Anesthesiologist: Reed Breech, MD ?Performed: anesthesiologist  ? ?Preanesthetic Checklist ?Completed: patient identified, IV checked, risks and benefits discussed, surgical consent, monitors and equipment checked, pre-op evaluation and timeout performed ? ?Epidural ?Patient position: sitting ?Prep: Betadine ?Patient monitoring: heart rate, continuous pulse ox and blood pressure ?Approach: midline ?Location: L3-L4 ?Injection technique: LOR air ? ?Needle:  ?Needle type: Tuohy  ?Needle gauge: 17 G ?Needle length: 9 cm ?Needle insertion depth: 4.5 cm ?Catheter at skin depth: 9.5 cm ?Test dose: negative and 1.5% lidocaine with Epi 1:200 K ? ?Assessment ?Sensory level: T4 ? ?Additional Notes ?Reason for block:procedure for pain ? ? ? ?

## 2022-04-13 NOTE — Discharge Summary (Signed)
? ?  Postpartum Discharge Summary ? ?Date of Service 04/14/2022 ? ?   ?Patient Name: Cindy Benson ?DOB: 1996/08/31 ?MRN: [id number] ? ?Date of admission: 04/12/2022 ?Delivery date:04/13/2022  ?Delivering provider:Melissa Norberto Sorenson, CNM ?Date of discharge:  ?Admitting diagnosis: Labor and delivery, indication for care [O75.9] ?Intrauterine pregnancy: [redacted]w[redacted]d     ?Secondary diagnosis:  Principal Problem: ?  Labor and delivery, indication for care ?Active Problems: ?  Postpartum care following vaginal delivery ? ?Additional problems: None    ?Discharge diagnosis: Term Pregnancy Delivered                                              ?Post partum procedures: NA ?Augmentation: N/A ?Complications: None ? ?Hospital course: Onset of Labor With Vaginal Delivery      ?26 y.o. yo G3P1011 at [redacted]w[redacted]d was admitted in Active Labor on 04/12/2022. Patient had an uncomplicated labor course as follows:  ?Membrane Rupture Time/Date: 1:45 AM ,04/13/2022   ?Delivery Method:Vaginal, Spontaneous  ?Episiotomy: None  ?Lacerations:  1st degree;Perineal  ?Patient had an uncomplicated postpartum course.  She is ambulating, tolerating a regular diet, passing flatus, and urinating well. Patient is discharged home in stable condition on 04/14/22. ? ?Newborn Data: ?Birth date:04/13/2022  ?Birth time:2:21 AM  ?Gender:Female  ?Living status:Living  ?Apgars:8 ,9  ?Weight:3260 g  ? ?Magnesium Sulfate received: No ?BMZ received: No ?Rhophylac:N/A ?MMR:N/A ?T-DaP:Given prenatally ?Flu: N/A ?Transfusion:No ? ?Physical exam  ?Vitals:  ? 04/13/22 1142 04/13/22 1450 04/13/22 2037 04/14/22 0022  ?BP: 101/65 109/67 112/70 (!) 98/56  ?Pulse: (!) 52 (!) 48 (!) 55 (!) 58  ?Resp: $Remov'18 18 20 20  'CgYMup$ ?Temp: 98 ?F (36.7 ?C) 97.9 ?F (36.6 ?C) 97.7 ?F (36.5 ?C) (!) 97.4 ?F (36.3 ?C)  ?TempSrc: Oral Oral Oral Oral  ?SpO2:   100% 97%  ?Weight:      ?Height:      ? ?General: alert, cooperative, and no distress ?Lochia: appropriate ?Uterine Fundus: firm ?Incision: Healing well with no  significant drainage ?DVT Evaluation: No evidence of DVT seen on physical exam. ?Negative Homan's sign. ?Labs: ?Lab Results  ?Component Value Date  ? WBC 16.5 (H) 04/13/2022  ? HGB 11.2 (L) 04/13/2022  ? HCT 31.8 (L) 04/13/2022  ? MCV 90.9 04/13/2022  ? PLT 227 04/13/2022  ? ? ?  Latest Ref Rng & Units 02/20/2022  ? 11:56 AM  ?CMP  ?Glucose 70 - 99 mg/dL 86    ?BUN 6 - 20 mg/dL 7    ?Creatinine 0.44 - 1.00 mg/dL 0.51    ?Sodium 135 - 145 mmol/L 134    ?Potassium 3.5 - 5.1 mmol/L 3.4    ?Chloride 98 - 111 mmol/L 109    ?CO2 22 - 32 mmol/L 18    ?Calcium 8.9 - 10.3 mg/dL 8.6    ?Total Protein 6.5 - 8.1 g/dL 7.0    ?Total Bilirubin 0.3 - 1.2 mg/dL 0.5    ?Alkaline Phos 38 - 126 U/L 88    ?AST 15 - 41 U/L 17    ?ALT 0 - 44 U/L 18    ? ?Edinburgh Score: ? ?  04/13/2022  ? 11:05 AM  ?Cindy Benson Postnatal Depression Scale Screening Tool  ?I have been able to laugh and see the funny side of things. 0  ?I have looked forward with enjoyment to things. 0  ?I have blamed myself unnecessarily  when things went wrong. 0  ?I have been anxious or worried for no good reason. 0  ?I have felt scared or panicky for no good reason. 0  ?Things have been getting on top of me. 0  ?I have been so unhappy that I have had difficulty sleeping. 0  ?I have felt sad or miserable. 0  ?I have been so unhappy that I have been crying. 0  ?The thought of harming myself has occurred to me. 0  ?Edinburgh Postnatal Depression Scale Total 0  ? ? ? ? ?After visit meds:  ?Allergies as of 04/14/2022   ? ?   Reactions  ? Ibuprofen Nausea And Vomiting, Nausea Only  ? Other reaction(s): STOMACH UPSET ?Abdominal pain  ? ?  ? ?  ?Medication List  ?  ? ?STOP taking these medications   ? ?ferrous sulfate 324 (65 Fe) MG Tbec ?  ?nitrofurantoin (macrocrystal-monohydrate) 100 MG capsule ?Commonly known as: MACROBID ?  ? ?  ? ?TAKE these medications   ? ?prenatal multivitamin Tabs tablet ?Take 1 tablet by mouth daily at 12 noon. ?  ? ?  ? ? ? ?Discharge home in stable  condition ?Infant Feeding: Breast ?Infant Disposition:home with mother ?Discharge instruction: per After Visit Summary and Postpartum booklet. ?Activity: Advance as tolerated. Pelvic rest for 6 weeks.  ?Diet: routine diet ?Anticipated Birth Control: Plans Interval BTL This is discussed today. Alternative are: LARCs like IUD ?Postpartum Appointment:2 weeks and again in 4-6 weeks to discuss and schedule her for a TL ?Additional Postpartum F/U: Postpartum Depression checkup ?Future Appointments: ?Future Appointments  ?Date Time Provider [place]  ?04/27/2022 10:45 AM Norberto Sorenson, Ian Bushman, CNM [facility] None  ?05/26/2022  9:30 AM Lurlean Horns, CNM [facility] None  ? ?Follow up Visit: ? Follow-up Information   ? ? [facility] Follow up in 2 week(s).   ?Why: Please make an appointment to see Missy in 2 weeks and then at 5-6 with an MD to discuss your tubal ligation. ?Contact information: ?[address]  [address] ?[place] ?[phone] ? ?  ?  ? ?  ?  ? ?  ? ? ? ?  ? ?04/14/2022 ?Cindy Benson, CNM ? ? ?

## 2022-04-13 NOTE — Lactation Note (Signed)
This note was copied from a baby's chart. ?Lactation Consultation Note ? ?Patient Name: Cindy Benson ?Today's Date: 04/13/2022 ?Reason for consult: Initial assessment;Mother's request;1st time breastfeeding;Early term 37-38.6wks;Breastfeeding assistance ?Age:26 hours ? ?Maternal Data ?This is mom's 2nd baby, and her first breastfeeding experience. Per mom she formula fed first baby. She reports she did not have breastfeeding education/support with first child which contributed to her decision at the time. Mom verbalizes she would like to breastfeed this baby and requested LC assistance today. ?Has patient been taught Hand Expression?: Yes ?Does the patient have breastfeeding experience prior to this delivery?: No ?Mom was able to independently hand express colostrum. ? ?Feeding ?Mother's Current Feeding Choice: Breast Milk ?Assisted mom with feeding. Provided tips and strategies to maximize latch and positioning techniques. Baby after a few attempts was able to sustain latch, breastfeeding with good latch, multiple swallows noted, and mother was able to identify swallows. Mom reports, "I feel more confident know." Reviewed frequency and duration of feeds, how to know the baby is getting enough, cluster feeding, and the normative breastfeeding process in the early post partum period. ? ?LATCH Score ?Latch: Grasps breast easily, tongue down, lips flanged, rhythmical sucking. ? ?Audible Swallowing: Spontaneous and intermittent ? ?Type of Nipple: Everted at rest and after stimulation ? ?Comfort (Breast/Nipple): Soft / non-tender ? ?Hold (Positioning): Assistance needed to correctly position infant at breast and maintain latch. ? ?LATCH Score: 9 ? ?Interventions ?Interventions: Breast feeding basics reviewed;Assisted with latch;Breast massage;Hand express;Breast compression;Adjust position;Support pillows;Position options;Education ? ?Discharge ?Pump: Personal (mom has a hands free pump for home use.) ?Per mom she will  follow-up for baby's care at [facility].  ? ?Consult Status ?Consult Status: Follow-up ?Date:  (04/14/22) ?Follow-up type: In-patient ? ?Update provided to care nurse. ? ?Fuller Song ?04/13/2022, 1:30 PM ? ? ? ?

## 2022-04-14 ENCOUNTER — Encounter: Payer: Self-pay | Admitting: Obstetrics

## 2022-04-14 ENCOUNTER — Encounter: Payer: BC Managed Care – PPO | Admitting: Obstetrics

## 2022-04-14 NOTE — Lactation Note (Signed)
This note was copied from a baby's chart. ?Lactation Consultation Note ? ?Patient Name: Cindy Benson ?Today's Date: 04/14/2022 ?Reason for consult: Follow-up assessment;Early term 37-38.6wks;Breastfeeding assistance;Mother's request;Other (Comment) (did not breastfeed her first) ?Age:26 hours ? ?Lactation follow-up prior to anticipated discharge. ? ?Maternal Data ?Has patient been taught Hand Expression?: Yes ?Does the patient have breastfeeding experience prior to this delivery?: No ? ?Mom needing reassurance re: breastfeeding with her second child- reports BF w/ first did not go well, she wasn't educated or guided while in the hospital. ? ?Feeding ?Mother's Current Feeding Choice: Breast Milk ? ?Baby has been feeding well, voiding and stooling, latching, and received a circumcision this morning and has been sleepy since. ? ?LATCH Score ?Latch: Grasps breast easily, tongue down, lips flanged, rhythmical sucking. ? ?Audible Swallowing: Spontaneous and intermittent ? ?Type of Nipple: Everted at rest and after stimulation ? ?Comfort (Breast/Nipple): Soft / non-tender ? ?Hold (Positioning): Assistance needed to correctly position infant at breast and maintain latch. ? ?LATCH Score: 9 ? ?LC provided reassurance with describing normal behaviors following circumcision along with feeding patterns and behaviors of babies born at 28 weeks. ?Worked with mom to wake baby, allowed baby to suckle on gloved finger (notes slightly high arch), baby became more alert. LC assisted slightly with position; baby latched with minimal other assistance, strong rhythmic sucking pattern with loud audible swallows throughout feeding. ? ?Lactation Tools Discussed/Used ?  ? ?Interventions ?Interventions: Breast feeding basics reviewed;Assisted with latch;Breast compression;Adjust position;Support pillows;Education;Coconut oil;Comfort gels ? ?LC reviewed tips for waking baby if needed, hand expression/milk removal if baby is sleepy, keeping  baby awake/alert at the breast, signs of transfer and deep latch (round nipple, audible swallows, softening of breast tissue), tracking of void and stools. ? ?Discharge ?Discharge Education: Engorgement and breast care;Warning signs for feeding baby;Outpatient recommendation ? ?Guidance given for anticipated breast fullness/breast changes, management of breast fullness, prevention of engorgement and plugged ducts, and available outpatient BF assistance if needed. ?Referenced again the Enjoy booklet for guidance at home for common questions. ? ?Consult Status ?Consult Status: Complete ?Date: 04/14/22 ?Follow-up type: Call as needed ? ? ? ?Lavonia Drafts ?04/14/2022, 12:43 PM ? ? ? ?

## 2022-04-14 NOTE — Anesthesia Postprocedure Evaluation (Signed)
Anesthesia Post Note ? ?Patient: Cindy Benson ? ?Procedure(s) Performed: AN AD HOC LABOR EPIDURAL ? ?Patient location during evaluation: Mother Baby ?Anesthesia Type: Epidural ?Level of consciousness: awake and alert ?Pain management: pain level controlled ?Vital Signs Assessment: post-procedure vital signs reviewed and stable ?Respiratory status: spontaneous breathing, nonlabored ventilation and respiratory function stable ?Cardiovascular status: stable ?Postop Assessment: no headache, no backache and epidural receding ?Anesthetic complications: no ? ? ?No notable events documented. ? ? ?Last Vitals:  ?Vitals:  ? 04/13/22 2037 04/14/22 0022  ?BP: 112/70 (!) 98/56  ?Pulse: (!) 55 (!) 58  ?Resp: 20 20  ?Temp: 36.5 ?C (!) 36.3 ?C  ?SpO2: 100% 97%  ?  ?Last Pain:  ?Vitals:  ? 04/14/22 0750  ?TempSrc:   ?PainSc: 2   ? ? ?  ?  ?  ?  ?  ?  ? ?[name] ? ? ? ? ?

## 2022-04-14 NOTE — Progress Notes (Signed)
Patient given discharge instructions, patient verbalizes understanding. Patient and baby discharged home with patient's significant other (FOB). Volunteer came with wheelchair to take to front of hospital and car.  ?

## 2022-04-23 ENCOUNTER — Ambulatory Visit: Payer: Self-pay

## 2022-04-23 NOTE — Lactation Note (Signed)
This note was copied from a baby's chart. ?Lactation Consultation Note ? ?Patient Name: Cindy Benson ?Today's Date: 04/23/2022 ?Reason for consult: Initial assessment;Engorgement ?Age:26 days ? ? ?Initial Lactation Consult: ? ?Received a phone from pediatrics regarding engorgement. ? ?Baby "Teagan" was admitted yesterday due to hypothermia, lethargy, pallor and poor feeding per mother.  Mother originally had taken him to [facility] ED and left due to a 9 hour wait time.  She was then called by the MD who recommended she bring him to [facility].  Mother brought "Teagan" to [facility] ED and was subsequently admitted to the pediatric unit. ? ?Per mother and RN, he has begun to feed much better now and has a termperature WNL.  When I arrived mother had an ice pack on her right breast and was pumping using her own personal pump on the left breast.  She finished pumping and obtained 60 mls of expressed breast milk.  Assessed mother's breasts and she was not engorged.  Explained the difference between a "full" breast and an "engorged" breast.  Mother relieved.  Encouraged her to continue doing breast massage and hand expression.  Mother made aware of engorgement signs/symptoms.  Suggested she breast feed as usual at the next feeding and to have her RN call for latch assistance if needed.  Also discussed nipple care and encouraged using expressed breast milk and coconut oil when she gets home for comfort if needed.  Mother appreciative of education given.  RN updated. ? ? ?Maternal Data ?  ? ?Feeding ?Mother's Current Feeding Choice: Breast Milk ? ?LATCH Score ?  ? ?  ? ?  ? ?  ? ?  ? ?  ? ? ?Lactation Tools Discussed/Used ?  ? ?Interventions ?Interventions: Education ? ?Discharge ?Pump: Personal ? ?Consult Status ?Consult Status: PRN ?Date: 04/23/22 ?Follow-up type: Call as needed ? ? ? ?Nautika Cressey R Keiondre Colee ?04/23/2022, 5:05 PM ? ? ? ?

## 2022-04-26 DIAGNOSIS — F4322 Adjustment disorder with anxiety: Secondary | ICD-10-CM | POA: Diagnosis not present

## 2022-04-27 ENCOUNTER — Telehealth: Payer: BC Managed Care – PPO | Admitting: Obstetrics

## 2022-04-27 ENCOUNTER — Telehealth (INDEPENDENT_AMBULATORY_CARE_PROVIDER_SITE_OTHER): Payer: BC Managed Care – PPO | Admitting: Obstetrics

## 2022-04-27 MED ORDER — HYDROXYZINE HCL 10 MG PO TABS: 10.0000 mg | ORAL_TABLET | Freq: Three times a day (TID) | ORAL | 1 refills | Status: DC | PRN

## 2022-04-27 NOTE — Progress Notes (Signed)
Virtual Visit via Telephone Note ? ?I connected with Cindy Benson on 04/27/22 at  3:30 PM EDT by telephone and verified that I am speaking with the correct person using two identifiers. ? ?Location: ?Patient: Home ?Provider: Office ?  ?I discussed the limitations, risks, security and privacy concerns of performing an evaluation and management service by telephone and the availability of in person appointments. The patient expressed understanding and agreed to proceed. ? ? ?History of Present Illness: ?Cindy Benson is a 26 y.o. G3P1011 who is s/p NSVB of a viable female "Cindy Benson" at [redacted]w[redacted]d on 04/13/22. She received epidural anesthesia. She had a first degree perineal laceration that was repaired. She is breastfeeding.  ? ?  ?Observations/Objective: ?1) Bleeding: like a light period ?2) Pain: denies cramping and pain ?3) Breasts: some engorgement since Cindy Benson prefers to latch to the right side. Working on [employer]. ?4) Perineum: stitches healing; no concerns ?5) Bladder: normal voiding ?6) Bowel: some constipation ?7) Mood: some concerns about depression and anxiety. Feels overwhelmed and like she has to do it all by herself. Has some help at bedtime from husband. MIL, mother, and friend are occasionally available to help. 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  ? ?Assessment and Plan: ?1) Normal PP healing ?2) Concerns for anxiety/depression ? ?Follow Up Instructions: ?1) Reviewed normal PP care and anticipatory guidance. Encouraged lactation visit. ?2) Discussed using available resources for help to reduce feelings of overwhelm. Reviewed self-care. Encouraged to help husband take on more responsibility for childcare. [place] has a therapist she speaks to when she is "in crisis;" discussed setting more regular appointments. Normagene would like some medication for anxiety relief but does not want to take a daily medication. Rx for  hydroxyzine sent with instructions for safety and how to take. Will f/u at 6 week visit. ?  ?I discussed the assessment and treatment plan with the patient. The patient was provided an opportunity to ask questions and all were answered. The patient agreed with the plan and demonstrated an understanding of the instructions. ?  ?The patient was advised to call back or seek an in-person evaluation if the symptoms worsen or if the condition fails to improve as anticipated. ? ?I provided 8 minutes of non-face-to-face time during this encounter. ? ? ?Glenetta Borg, CNM ? ? ?

## 2022-05-10 ENCOUNTER — Telehealth: Payer: Self-pay | Admitting: Certified Nurse Midwife

## 2022-05-10 NOTE — Telephone Encounter (Signed)
Pt called asking for update on FMLA forms- pt is asking if completed can she just pick up - it is time sensitive.

## 2022-05-11 ENCOUNTER — Telehealth: Payer: Self-pay | Admitting: Obstetrics

## 2022-05-11 ENCOUNTER — Encounter: Payer: Self-pay | Admitting: Certified Nurse Midwife

## 2022-05-11 NOTE — Telephone Encounter (Signed)
PT would like to pick up her FMLA forms here in the office. Pt states "her job is requiring it and is  giving her a hard time". Pt asked for a call back

## 2022-05-11 NOTE — Telephone Encounter (Signed)
FMLA form was faxed 05/03/2022 with confirmed fax report. I have made a copy for patient to pick up in office at earliest convenience.  

## 2022-05-11 NOTE — Telephone Encounter (Signed)
FMLA form was faxed 05/03/2022 with confirmed fax report. I have made a copy for patient to pick up in office at earliest convenience.

## 2022-05-21 DIAGNOSIS — F4322 Adjustment disorder with anxiety: Secondary | ICD-10-CM | POA: Diagnosis not present

## 2022-05-26 ENCOUNTER — Ambulatory Visit (INDEPENDENT_AMBULATORY_CARE_PROVIDER_SITE_OTHER): Payer: BC Managed Care – PPO | Admitting: Obstetrics

## 2022-05-26 ENCOUNTER — Encounter: Payer: Self-pay | Admitting: Obstetrics

## 2022-05-26 NOTE — Progress Notes (Signed)
SUBJECTIVE  Cindy Benson is a 26 y.o. G3P1011 who gave birth to a female infant at [redacted]w[redacted]d via NSVD on 04/13/22 with epidural anesthesia. She had a first degree perineal laceration that was repaired.  She is  pumping and formula feeding . She would like to return to work. She is here today for a PP follow-up visit.   ROS  A comprehensive review of systems was negative.  Mood: Feels much better. Has not used hydroxyzine. Bowel function: normal Bladder function: normal Vaginal bleeding: stopped Pain: denies  Denies difficulty breathing, chest pain, lower extremity pain or swelling, excessive vaginal bleeding, vaginal pain. She has not resumed intercourse.  Infant: Had a 5-day hospital stay but is doing better. Eating well and growing.  OBJECTIVE Last Weight  Most recent update: 05/26/2022  9:33 AM    Weight  65.8 kg (145 lb)            Body mass index is 25.69 kg/m.  BP 124/74   Pulse 63   Ht 5\' 3"  (1.6 m)   Wt 145 lb (65.8 kg)   LMP  (LMP Unknown)   Breastfeeding Yes   BMI 25.69 kg/m  Ibuprofen  Past Medical/Surgical History Past Medical History:  Diagnosis Date   Anxiety    Past Surgical History:  Procedure Laterality Date   ECTOPIC PREGNANCY SURGERY Right    KNEE SURGERY Right     Last Pap:  10/19/21. Normal cytology  BP 124/74   Pulse 63   Ht 5\' 3"  (1.6 m)   Wt 145 lb (65.8 kg)   LMP  (LMP Unknown)   Breastfeeding Yes   BMI 25.69 kg/m  General appearance: alert, cooperative, and appears stated age Head: Normocephalic, without obvious abnormality, atraumatic Eyes: negative findings: lids and lashes normal and conjunctivae and sclerae normal Neck: no adenopathy, supple, symmetrical, trachea midline, and thyroid not enlarged, symmetric, no tenderness/mass/nodules Lungs: clear to auscultation bilaterally Breasts:  deferred Heart: regular rate and rhythm, S1, S2 normal, no murmur, click, rub or gallop Abdomen: soft, non-tender; bowel sounds normal; no  masses,  no organomegaly. Fundus not palpable. Pelvic:  external genitalia normal, laceration well-healed Extremities: extremities normal, atraumatic, no cyanosis or edema Pulses: 2+ and symmetric Skin: Skin color, texture, turgor normal. No rashes or lesions Lymph nodes: Cervical, supraclavicular, and axillary nodes normal.  ASSESSMENT 1) 6 weeks postpartum 2) Normal postpartum course 3) Desires IUD  PLAN  Discussed PP mood changes and coping strategies. Reviewed return to work and resumption of normal activities and intercourse. Discussed options for hormonal and copper IUD. Will schedule Mirena IUD placement.   10/21/21, CNM

## 2022-06-10 ENCOUNTER — Encounter: Payer: BC Managed Care – PPO | Admitting: Obstetrics

## 2022-06-14 ENCOUNTER — Encounter: Payer: Self-pay | Admitting: Obstetrics

## 2022-06-14 ENCOUNTER — Ambulatory Visit (INDEPENDENT_AMBULATORY_CARE_PROVIDER_SITE_OTHER): Payer: BC Managed Care – PPO | Admitting: Obstetrics

## 2022-06-14 VITALS — BP 121/74 | HR 59 | Ht 63.0 in | Wt 150.2 lb

## 2022-06-14 DIAGNOSIS — Z30430 Encounter for insertion of intrauterine contraceptive device: Secondary | ICD-10-CM

## 2022-07-02 ENCOUNTER — Telehealth: Payer: Self-pay | Admitting: Obstetrics and Gynecology

## 2022-07-02 NOTE — Telephone Encounter (Signed)
Pt called asking for a copy of FMLA paperwork.

## 2022-07-02 NOTE — Telephone Encounter (Signed)
Copy left at front office.

## 2022-07-10 DIAGNOSIS — S92345A Nondisplaced fracture of fourth metatarsal bone, left foot, initial encounter for closed fracture: Secondary | ICD-10-CM | POA: Diagnosis not present

## 2022-07-12 DIAGNOSIS — F4322 Adjustment disorder with anxiety: Secondary | ICD-10-CM | POA: Diagnosis not present

## 2022-07-19 DIAGNOSIS — M79672 Pain in left foot: Secondary | ICD-10-CM | POA: Diagnosis not present

## 2022-07-20 ENCOUNTER — Encounter: Payer: Self-pay | Admitting: Certified Nurse Midwife

## 2022-07-26 NOTE — Telephone Encounter (Signed)
Patient called and states that there in no pain during intercourse. Pt states she bleeds the next day like menstrual cycle. Please advise.

## 2022-08-25 ENCOUNTER — Encounter: Payer: BC Managed Care – PPO | Admitting: Obstetrics

## 2022-09-06 ENCOUNTER — Encounter: Payer: BC Managed Care – PPO | Admitting: Obstetrics

## 2022-09-23 ENCOUNTER — Encounter: Payer: Self-pay | Admitting: Certified Nurse Midwife

## 2022-09-23 ENCOUNTER — Encounter: Payer: Self-pay | Admitting: Obstetrics

## 2022-09-24 ENCOUNTER — Encounter: Payer: Self-pay | Admitting: Obstetrics

## 2022-09-28 ENCOUNTER — Encounter: Payer: Self-pay | Admitting: Obstetrics

## 2023-01-15 IMAGING — US US ABDOMEN LIMITED
1 series · 14 of 25 positions shown · non-contrast
Comparison: None.

CLINICAL DATA: Right upper quadrant pain for 7 days.

EXAM:
ULTRASOUND ABDOMEN LIMITED RIGHT UPPER QUADRANT

[Series 1: us abdomen limited ruq (liver/gb) · 14 of 69 slices shown]
[im 1/69]
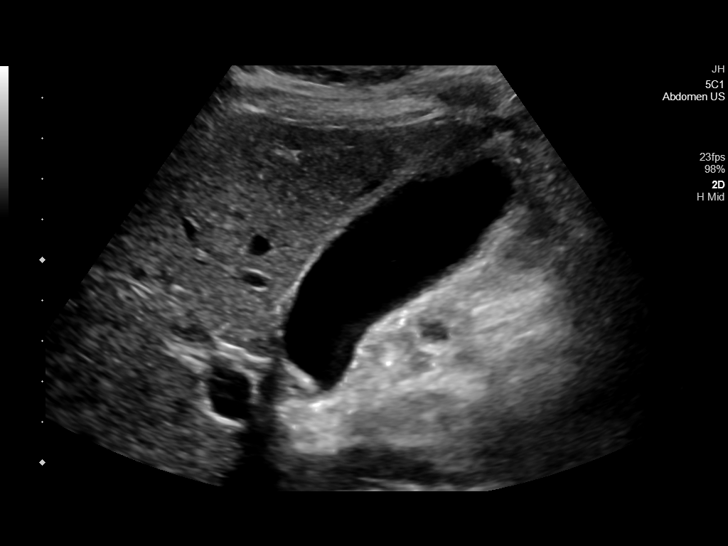
[im 6/69]
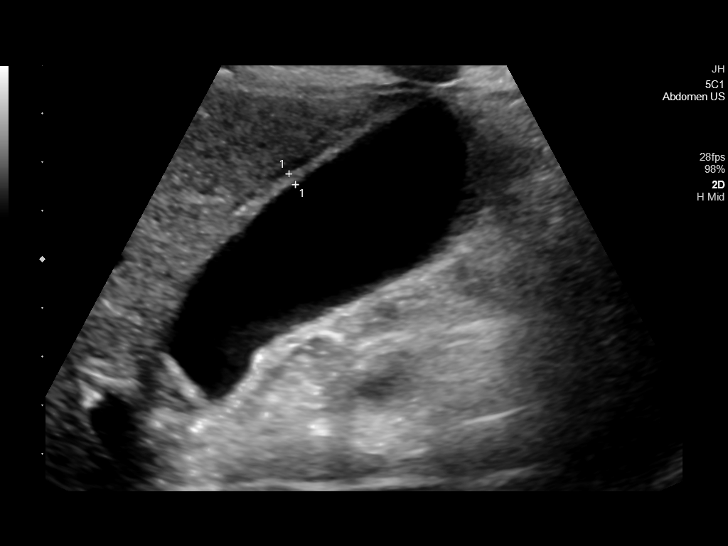
[im 12/69]
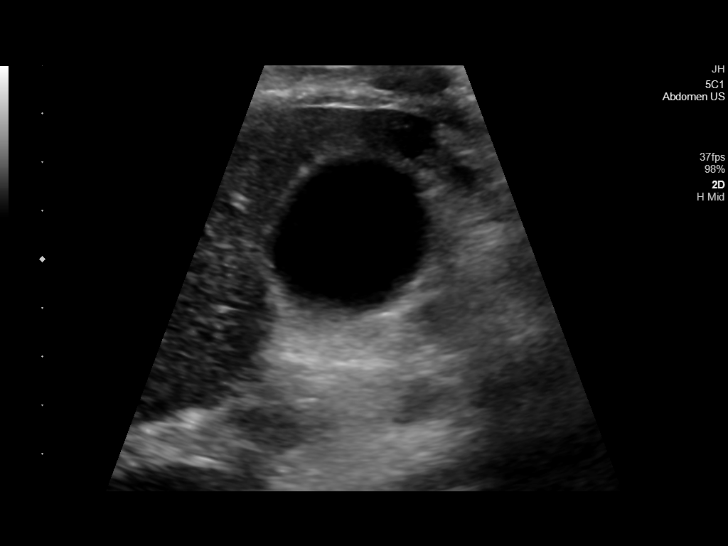
[im 18/69]
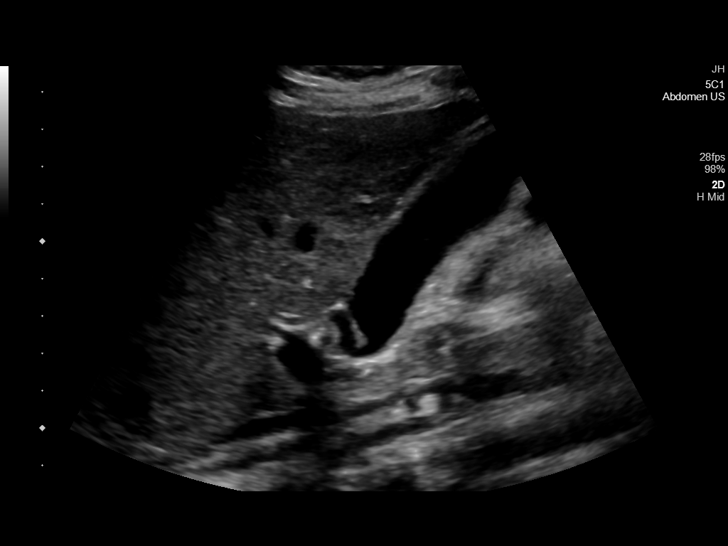
[im 23/69]
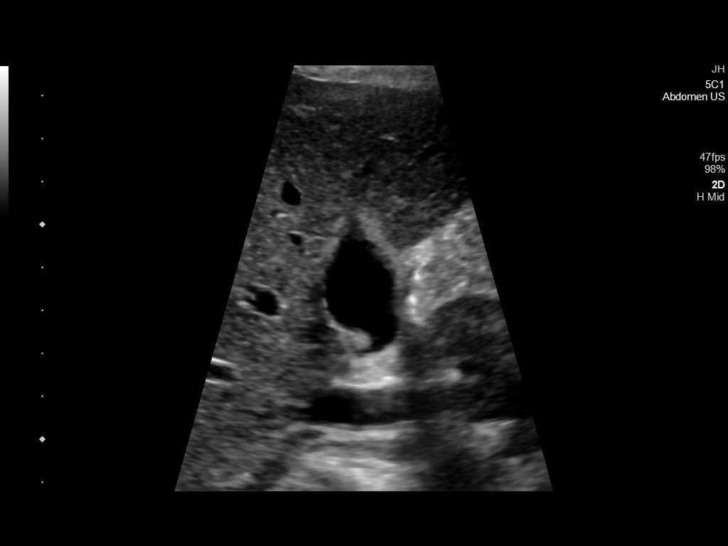
[im 26/69]
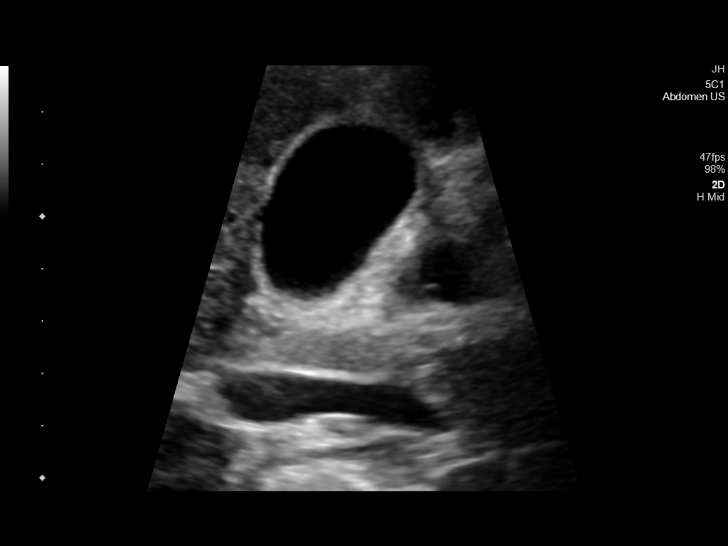
[im 32/69]
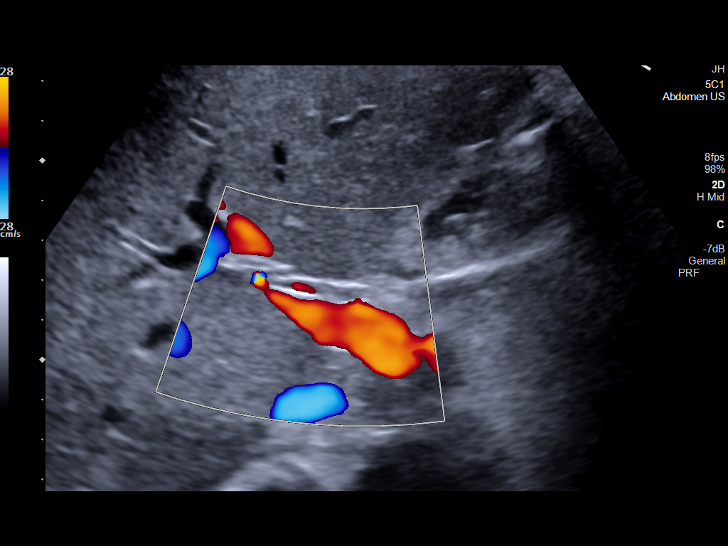
[im 37/69]
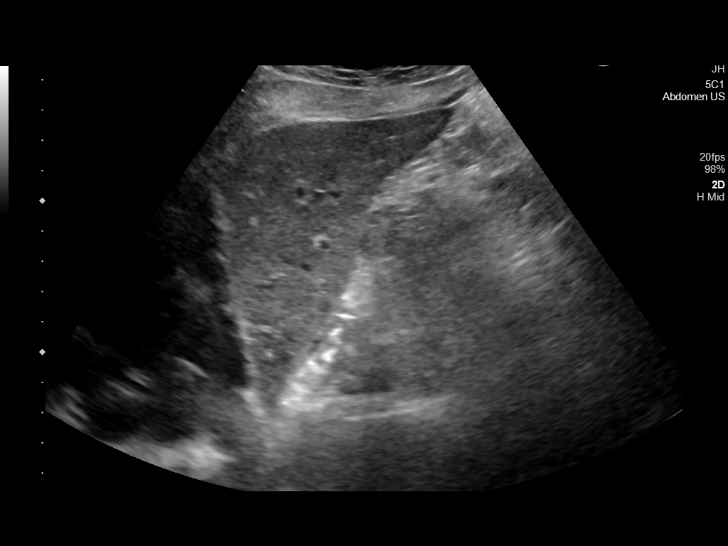
[im 43/69]
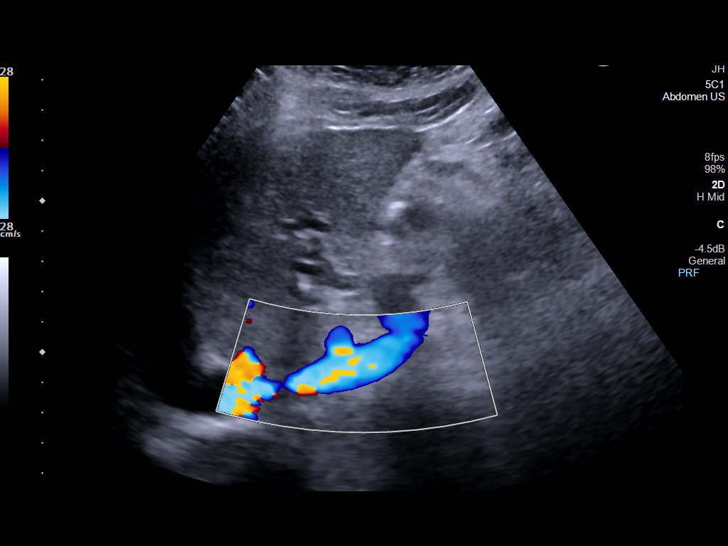
[im 46/69]
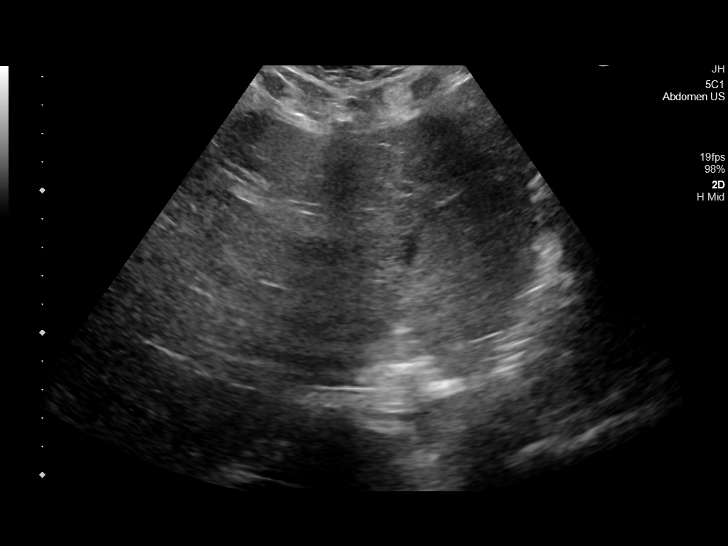
[im 52/69]
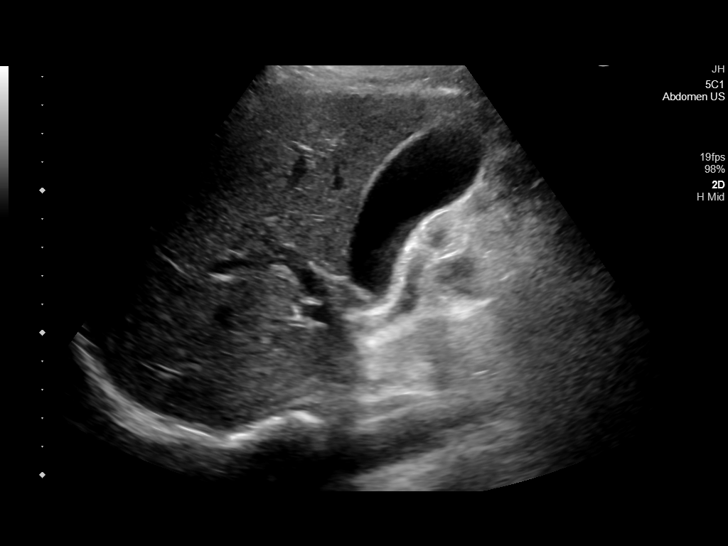
[im 57/69]
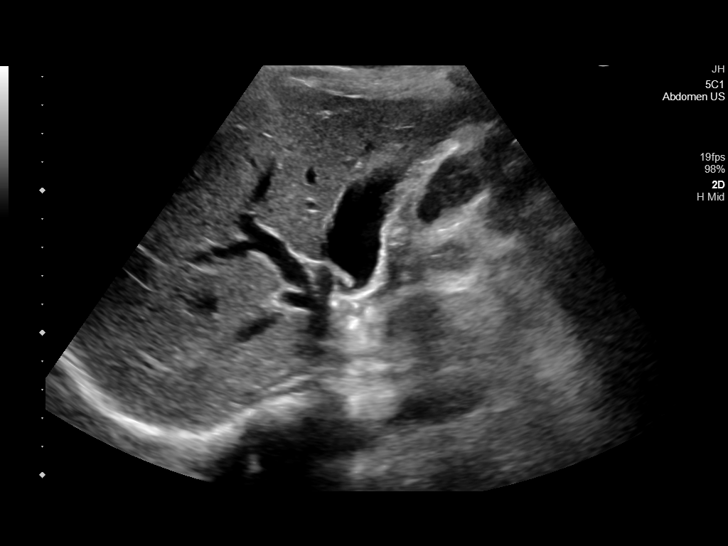
[im 63/69]
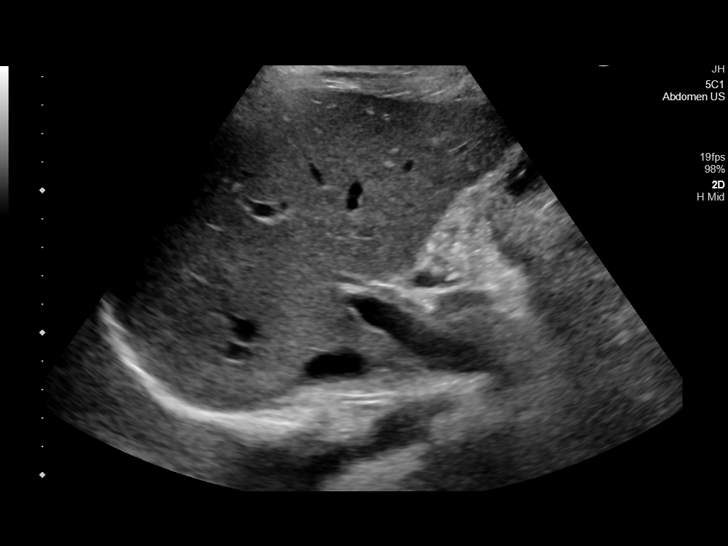
[im 69/69]
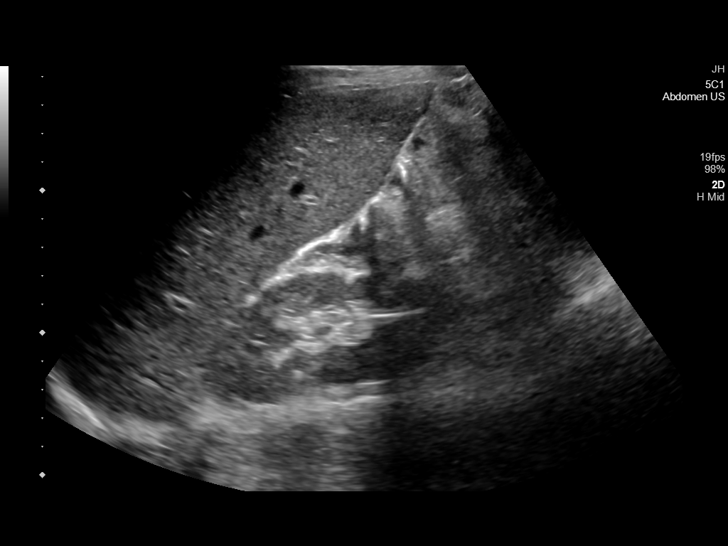

[14 of 25 positions shown; findings below may reference images not displayed]

FINDINGS: Gallbladder:

No gallstones or wall thickening visualized. No sonographic Murphy
sign noted by sonographer.

Common bile duct:

Diameter: 2 mm

Liver:

No focal lesion identified. Within normal limits in parenchymal
echogenicity. Portal vein is patent on color Doppler imaging with
normal direction of blood flow towards the liver.

Other: None.
IMPRESSION: Normal right upper quadrant ultrasound.

## 2023-03-03 IMAGING — US US MFM OB DETAIL+14 WK
1 series · 12 of 28 positions shown · non-contrast
Comparison: none

[Series 1: us mfm ob detail+14 wk · 83 acquisitions, 12 frames shown]
[im 4/83]
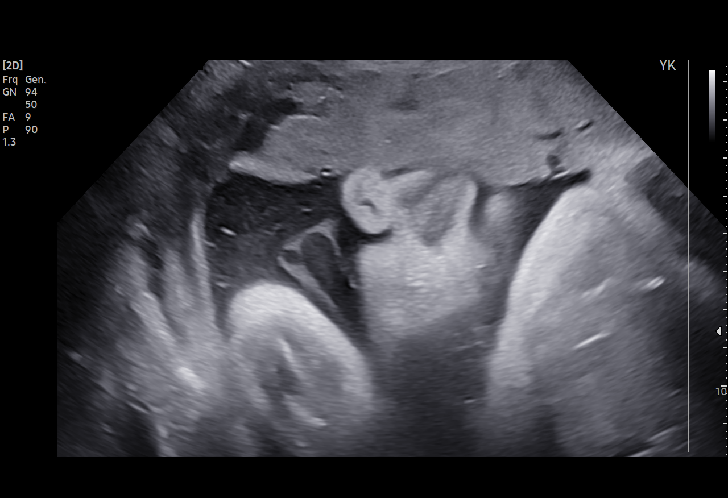
[im 10/83]
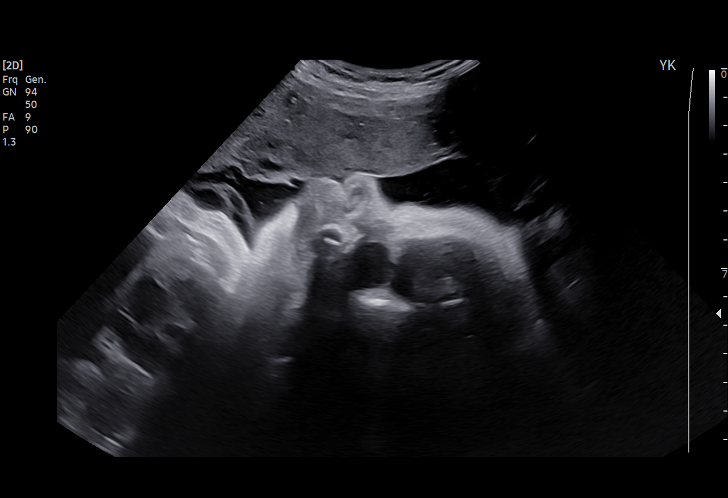
[im 16/83]
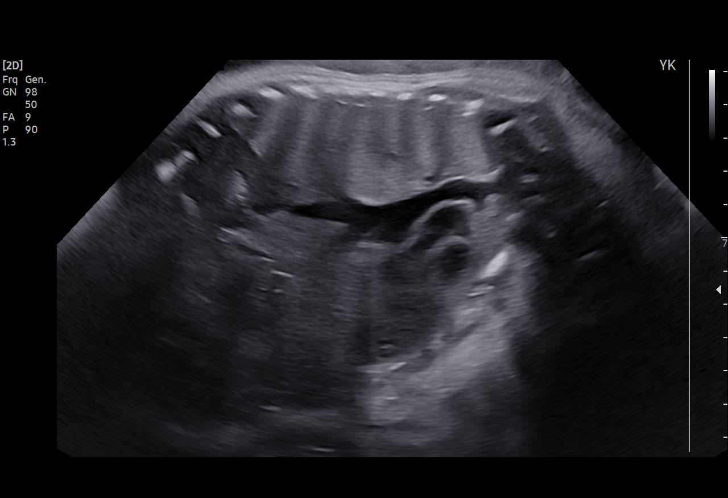
[im 25/83]
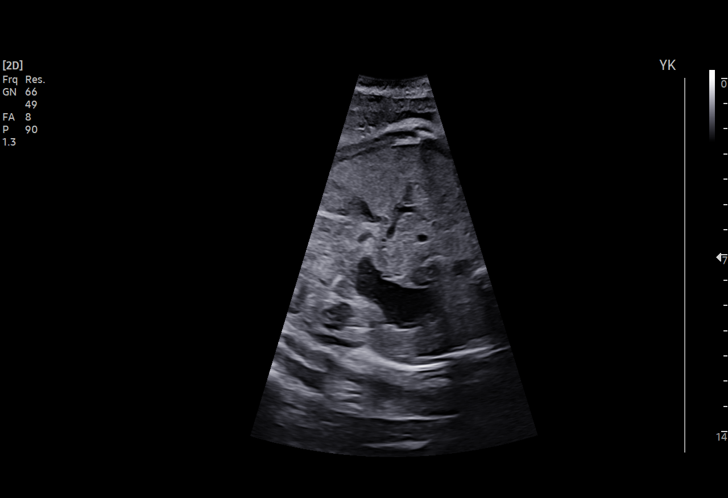
[im 31/83]
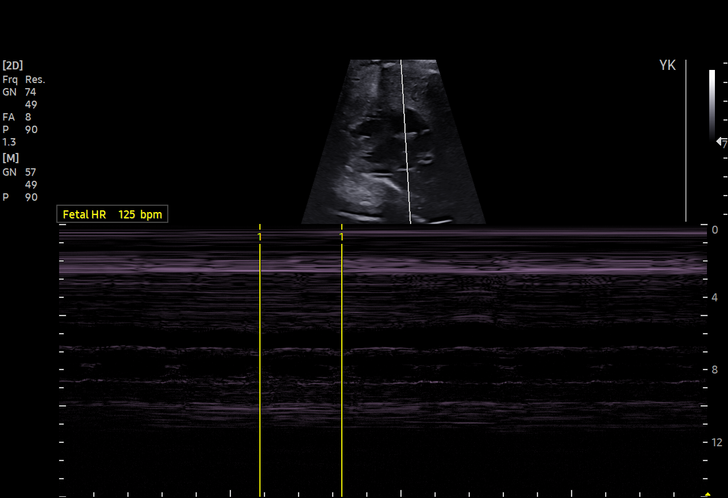
[im 37/83]
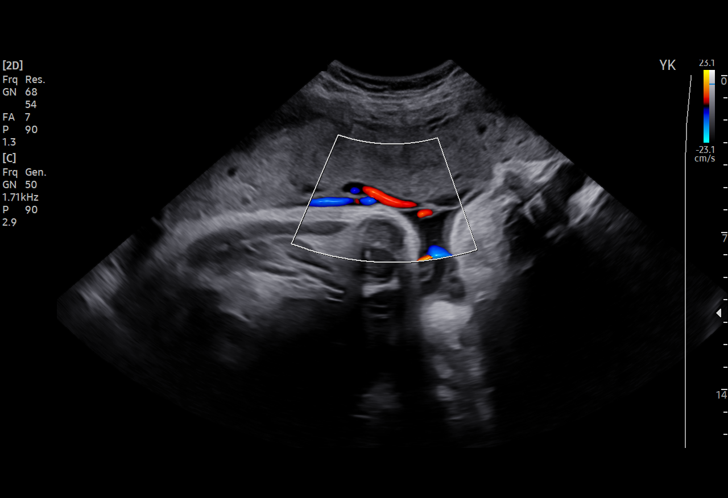
[im 46/83]
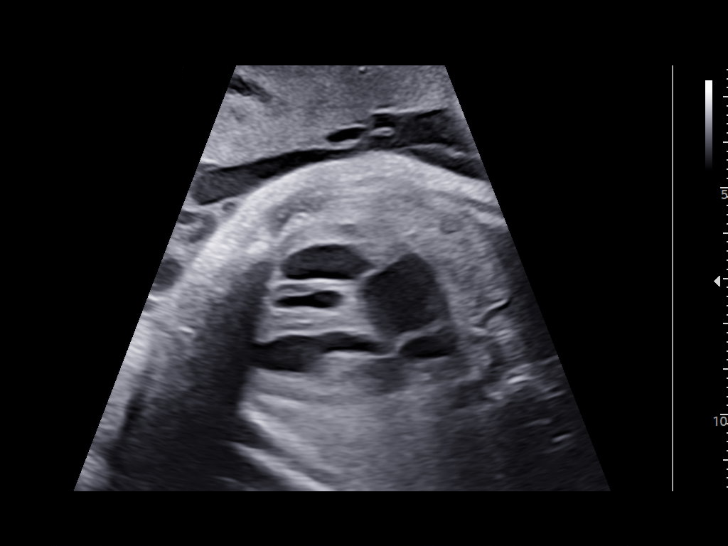
[im 52/83]
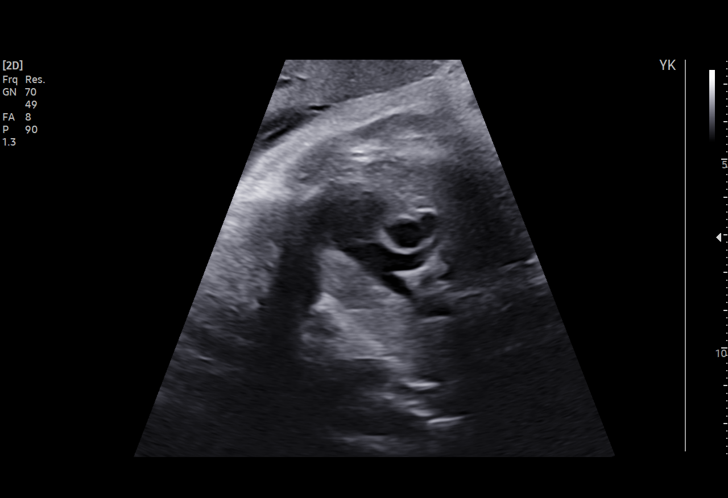
[im 58/83]
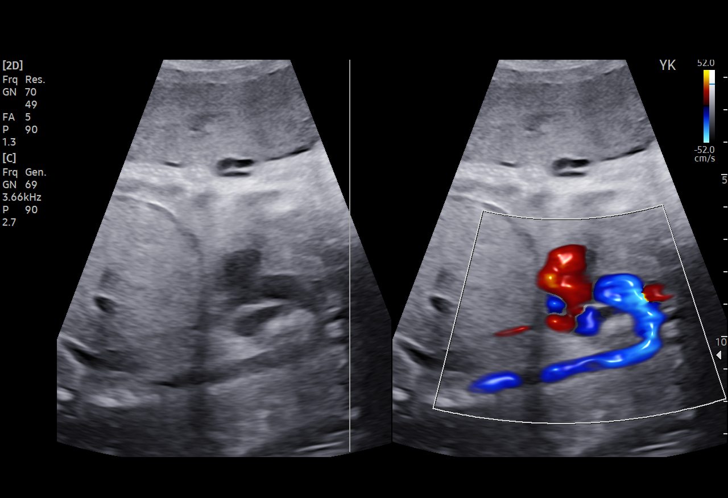
[im 67/83]
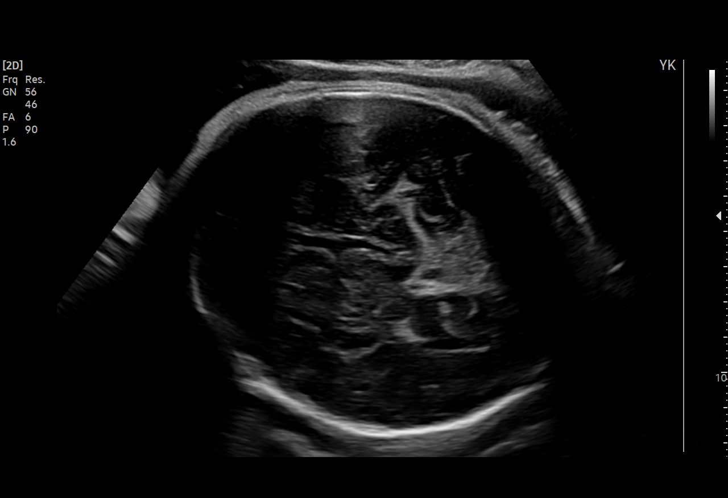
[im 73/83]
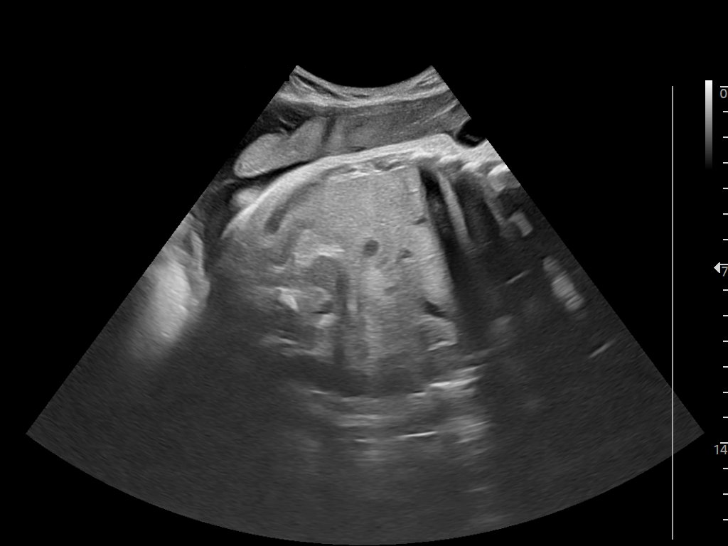
[im 79/83]
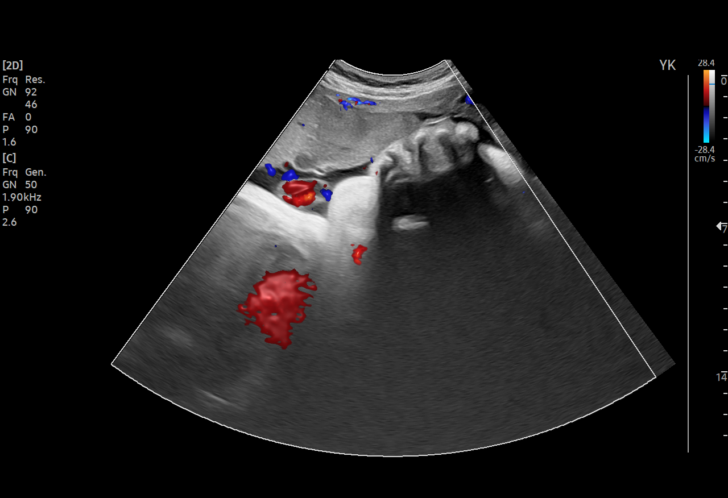

[12 of 28 positions shown; findings below may reference images not displayed]

SHIMIZU

Indications

 Abnormal fetal ultrasound ( Lower baseline
 FHR)
 Encounter for antenatal screening for
 malformations
 Poor obstetrical history (shoulder distocia)
 RatVM:Neg, XY
 Hx of Covid ([DATE])
 Anemia during pregnancy in third trimester
 37 weeks gestation of pregnancy
Fetal Evaluation

 Num Of Fetuses:         1
 Fetal Heart Rate(bpm):  131
 Cardiac Activity:       Observed
 Presentation:           Cephalic
 Placenta:               Anterior
 P. Cord Insertion:      Visualized, central

 Amniotic Fluid
 AFI FV:      Within normal limits

 AFI Sum(cm)     %Tile       Largest Pocket(cm)
 14.5            54

 RUQ(cm)       RLQ(cm)       LUQ(cm)        LLQ(cm)

Biophysical Evaluation
 Amniotic F.V:   Within normal limits       F. Tone:        Observed
 F. Movement:    Observed                   Score:          [DATE]
 F. Breathing:   Observed
Biometry

 BPD:      93.2  mm     G. Age:  37w 6d         86  %    CI:        76.06   %    70 - 86
                                                         FL/HC:      21.6   %    20.8 -
 HC:      338.7  mm     G. Age:  38w 6d         70  %    HC/AC:      1.00        0.92 -
 AC:      338.2  mm     G. Age:  37w 5d         82  %    FL/BPD:     78.5   %    71 - 87
 FL:       73.2  mm     G. Age:  37w 3d         61  %    FL/AC:      21.6   %    20 - 24

 Est. FW:    5518  gm      7 lb 5 oz     77  %
OB History

 Gravidity:    3         Term:   1
 Ectopic:      1        Living:  1
Gestational Age

 LMP:           37w 0d        Date:  07/23/21                 EDD:   04/29/22
 U/S Today:     38w 0d                                        EDD:   04/22/22
 Best:          37w 0d     Det. By:  LMP  (07/23/21)          EDD:   04/29/22
Anatomy

 Cranium:               Appears normal         Aortic Arch:            Appears normal
 Cavum:                 Appears normal         Ductal Arch:            Not well visualized
 Ventricles:            Appears normal         Diaphragm:              Appears normal
 Choroid Plexus:        Appears normal         Stomach:                Appears normal, left
                                                                       sided
 Cerebellum:            Appears normal         Abdomen:                Appears normal
 Posterior Fossa:       Appears normal         Abdominal Wall:         Not well visualized
 Nuchal Fold:           Not applicable (>20    Cord Vessels:           Appears normal (3
                        wks GA)                                        vessel cord)
 Face:                  Appears normal         Kidneys:                Appear normal
                        (orbits and profile)
 Lips:                  Appears normal         Bladder:                Appears normal
 Thoracic:              Appears normal         Spine:                  Ltd views no
                                                                       intracranial signs of
                                                                       NTD
 Heart:                 Appears normal         Upper Extremities:      Not well visualized
                        (4CH, axis, and
                        situs)
 RVOT:                  Appears normal         Lower Extremities:      Not well visualized
 LVOT:                  Appears normal

 Other:  Technicallly difficult due to advanced GA and maternal habitus. VC,
         3VV and 3VTV visualized.
Cervix Uterus Adnexa

 Cervix
 Not visualized (advanced GA >83wks)
 Right Ovary
 Not visualized.

 Left Ovary
 Not visualized.
Comments

 Carrigan Ostolaza was seen for an ultrasound exam today as a low
 baseline fetal heart rate in the low 100s range was noted
 during her prenatal visit a few days ago.  The patient denies
 any other problems in her current pregnancy and has
 screened negative for gestational diabetes.  She reports
 feeling fetal movements throughout the day.
 On today's exam, the overall EFW of 7 pounds 5 ounces
 measures at the 77th percentile for her gestational age.
 There was normal amniotic fluid noted.
 A BPP performed today was [DATE].
 The fetal heart rate was noted to be in the 120s to 130s
 range throughout today's exam.
 There were no obvious fetal anomalies noted on today's
 exam.  However, the views of the fetal anatomy were limited
 due to her advanced gestational age.
 Due to the low baseline fetal heart rate noted a few days ago,
 the patient should have another NST performed in their office
 next week.
 Should a low baseline be noted again during her NST next
 week, delivery should be considered at that time.
 Should the fetal heart rate be within normal limits during her
 next NST, delivery may be considered at 39 weeks or
 greater.  She should continue weekly NSTs until delivery.
 Fetal [place] Meronen instructions were reviewed.
 The patient stated that all of her questions have been
 answered.
 A total of 30 minutes was spent counseling and coordinating
 the care for this patient.  Greater than 50% of the time was
 spent in direct face-to-face contact.

## 2023-03-07 DIAGNOSIS — F4322 Adjustment disorder with anxiety: Secondary | ICD-10-CM | POA: Diagnosis not present

## 2023-07-20 DIAGNOSIS — F4322 Adjustment disorder with anxiety: Secondary | ICD-10-CM | POA: Diagnosis not present

## 2023-12-26 DIAGNOSIS — F4322 Adjustment disorder with anxiety: Secondary | ICD-10-CM | POA: Diagnosis not present

## 2024-02-10 ENCOUNTER — Ambulatory Visit (INDEPENDENT_AMBULATORY_CARE_PROVIDER_SITE_OTHER): Payer: BC Managed Care – PPO | Admitting: Family Medicine

## 2024-02-10 ENCOUNTER — Other Ambulatory Visit: Payer: Self-pay

## 2024-02-10 VITALS — BP 109/73 | HR 62 | Ht 63.0 in | Wt 168.2 lb

## 2024-02-10 DIAGNOSIS — R5383 Other fatigue: Secondary | ICD-10-CM

## 2024-02-10 NOTE — Progress Notes (Signed)
 New Patient Office Visit  Subjective    Patient ID: Cindy Benson, female    DOB: Sep 03, 1996  Age: 28 y.o. MRN: [id number]  CC:  Chief Complaint  Patient presents with   Establish Care    Establish care, talk about losing weight, overly tired    HPI Cindy Benson presents to establish care.  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  Takes no daily medications.  No known drug allergies.  Past medical history: None.  Past surgical history: Right knee, ectopic pregnancy.  Has an IUD and is amenorrheic.            Reports that she feels really tired since December.  She is working 42 to 45 hours a week but drives an hour to and from work daily.  Goes to bed at 8:30 at night and gets up at 4 AM.  Admits her nutrition is not very good she tends to snack.  She gets up in the morning and drinks red bull and may not eat for 7 or 8 hours.  She drinks stimulant drinks.  She does not get exercise outside of her job.  She has never had any thyroid problems.  No family history of thyroid disease.  Denies diarrhea or constipation abdominal pain, palpitations, shortness of breath, chest pain, wheezing, cough, peripheral edema, weight loss.  She is complaining about gaining weight and not being able to lose it.  She does snore but she does not wake up gasping for breath. GAD 7 is 0 PHQ-9 is 5   Outpatient Encounter Medications as of 02/10/2024  Medication Sig   [DISCONTINUED] hydrOXYzine (ATARAX) 10 MG tablet Take 1 tablet (10 mg total) by mouth 3 (three) times daily as needed.   [DISCONTINUED] Prenatal Vit-Fe Fumarate-FA (PRENATAL MULTIVITAMIN) TABS tablet Take 1 tablet by mouth daily at 12 noon.   No facility-administered encounter medications on file as of 02/10/2024.    Past Medical History:  Diagnosis Date   Anxiety     Past Surgical History:  Procedure Laterality Date   ECTOPIC PREGNANCY SURGERY Right    KNEE SURGERY Right     Family History  Problem Relation Age of Onset    Hypertension Father    Breast cancer Neg Hx    Cervical cancer Neg Hx    Ovarian cancer Neg Hx    Colon cancer Neg Hx     Social History   Socioeconomic History   Marital status: Married    Spouse name: Ethelene Browns "Onalee Hua"   Number of children: Not on file   Years of education: Not on file   Highest education level: Some college, no degree  Occupational History   Occupation: IT sales professional: [employer]  Tobacco Use   Smoking status: Never    Passive exposure: Never   Smokeless tobacco: Never  Vaping Use   Vaping status: Some Days  Substance and Sexual Activity   Alcohol use: Never   Drug use: Never   Sexual activity: Yes    Birth control/protection: None  Other Topics Concern   Not on file  Social History Narrative   Not on file   Social Drivers of Health   Financial Resource Strain: Low Risk  (02/10/2024)   Overall Financial Resource Strain (CARDIA)    Difficulty of Paying Living Expenses: Not hard at all  Food Insecurity: No Food Insecurity (02/10/2024)   Hunger Vital Sign    Worried About Running Out of Food in the Last Year:  Never true    Ran Out of Food in the Last Year: Never true  Transportation Needs: No Transportation Needs (02/10/2024)   PRAPARE - Administrator, Civil Service (Medical): No    Lack of Transportation (Non-Medical): No  Physical Activity: Unknown (02/10/2024)   Exercise Vital Sign    Days of Exercise per Week: 5 days    Minutes of Exercise per Session: Patient declined  Stress: No Stress Concern Present (02/10/2024)   [employer] of Occupational Health - Occupational Stress Questionnaire    Feeling of Stress : Not at all  Social Connections: Moderately Isolated (02/10/2024)   Social Connection and Isolation Panel [NHANES]    Frequency of Communication with Friends and Family: More than three times a week    Frequency of Social Gatherings with Friends and Family: More than three times a week    Attends Religious  Services: Never    Database administrator or Organizations: No    Attends Engineer, structural: Not on file    Marital Status: Married  Intimate Partner Violence: Unknown (02/08/2020)   Received from [facility]   Humiliation, Afraid, Rape, and Kick questionnaire    Fear of Current or Ex-Partner: Patient declined    Emotionally Abused: Patient declined    Physically Abused: Patient declined    Sexually Abused: Patient declined    ROS      Objective    BP 109/73 (BP Location: Left Arm, Patient Position: Sitting, Cuff Size: Normal)   Pulse 62   Ht 5\' 3"  (1.6 m)   Wt 168 lb 3.2 oz (76.3 kg)   SpO2 97%   BMI 29.80 kg/m   Physical Exam Vitals and nursing note reviewed.  Constitutional:      Appearance: Normal appearance.  HENT:     Head: Normocephalic and atraumatic.  Eyes:     Conjunctiva/sclera: Conjunctivae normal.  Cardiovascular:     Rate and Rhythm: Normal rate and regular rhythm.  Pulmonary:     Effort: Pulmonary effort is normal.     Breath sounds: Normal breath sounds.  Musculoskeletal:     Right lower leg: No edema.     Left lower leg: No edema.  Skin:    General: Skin is warm and dry.  Neurological:     Mental Status: She is alert and oriented to person, place, and time.  Psychiatric:        Mood and Affect: Mood normal.        Behavior: Behavior normal.        Thought Content: Thought content normal.        Judgment: Judgment normal.         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  Drives an hour to and from work daily.  Unlikely to be anemic as she has an IUD and is amenorrheic.  But will check CBC, CMP and thyroid function.  Fatigue may be stress related from the new job responsibilities and the additional time driving weekly, change in her sleep wake cycle.        Relevant Orders   CMP14+EGFR   CBC with  Differential   TSH + free T4    No follow-ups on file.   Alease Medina, MD

## 2024-02-10 NOTE — Assessment & Plan Note (Addendum)
 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  Drives an hour to and from work daily.  Unlikely to be anemic as she has an IUD and is amenorrheic.  But will check CBC, CMP and thyroid function.  Fatigue may be stress related from the new job responsibilities and the additional time driving weekly, change in her sleep wake cycle.

## 2024-02-11 ENCOUNTER — Encounter: Payer: Self-pay | Admitting: Family Medicine

## 2024-02-20 DIAGNOSIS — R5383 Other fatigue: Secondary | ICD-10-CM | POA: Diagnosis not present

## 2024-02-21 ENCOUNTER — Encounter: Payer: Self-pay | Admitting: Family Medicine

## 2024-02-21 LAB — CBC WITH DIFFERENTIAL/PLATELET
Basophils Absolute: 0 10*3/uL (ref 0.0–0.2)
Basos: 1 %
EOS (ABSOLUTE): 0.1 10*3/uL (ref 0.0–0.4)
Eos: 1 %
Hematocrit: 42.2 % (ref 34.0–46.6)
Hemoglobin: 14.6 g/dL (ref 11.1–15.9)
Immature Grans (Abs): 0 10*3/uL (ref 0.0–0.1)
Immature Granulocytes: 0 %
Lymphocytes Absolute: 2.1 10*3/uL (ref 0.7–3.1)
Lymphs: 26 %
MCH: 31.1 pg (ref 26.6–33.0)
MCHC: 34.6 g/dL (ref 31.5–35.7)
MCV: 90 fL (ref 79–97)
Monocytes Absolute: 0.4 10*3/uL (ref 0.1–0.9)
Monocytes: 5 %
Neutrophils Absolute: 5.4 10*3/uL (ref 1.4–7.0)
Neutrophils: 67 %
Platelets: 287 10*3/uL (ref 150–450)
RBC: 4.7 x10E6/uL (ref 3.77–5.28)
RDW: 12 % (ref 11.7–15.4)
WBC: 8.1 10*3/uL (ref 3.4–10.8)

## 2024-02-21 LAB — TSH+FREE T4
Free T4: 0.84 ng/dL (ref 0.82–1.77)
TSH: 3.95 u[IU]/mL (ref 0.450–4.500)

## 2024-02-21 LAB — CMP14+EGFR
ALT: 34 IU/L — ABNORMAL HIGH (ref 0–32)
AST: 27 IU/L (ref 0–40)
Albumin: 4.9 g/dL (ref 4.0–5.0)
Alkaline Phosphatase: 93 IU/L (ref 44–121)
BUN/Creatinine Ratio: 14 (ref 9–23)
BUN: 9 mg/dL (ref 6–20)
Bilirubin Total: 0.4 mg/dL (ref 0.0–1.2)
CO2: 24 mmol/L (ref 20–29)
Calcium: 10 mg/dL (ref 8.7–10.2)
Chloride: 103 mmol/L (ref 96–106)
Creatinine, Ser: 0.64 mg/dL (ref 0.57–1.00)
Globulin, Total: 3.1 g/dL (ref 1.5–4.5)
Glucose: 80 mg/dL (ref 70–99)
Potassium: 3.8 mmol/L (ref 3.5–5.2)
Sodium: 140 mmol/L (ref 134–144)
Total Protein: 8 g/dL (ref 6.0–8.5)
eGFR: 123 mL/min/{1.73_m2} (ref >59)

## 2024-03-09 ENCOUNTER — Telehealth: Payer: Self-pay

## 2024-03-09 ENCOUNTER — Other Ambulatory Visit: Payer: Self-pay | Admitting: Family Medicine

## 2024-03-09 MED ORDER — WEGOVY 0.25 MG/0.5ML ~~LOC~~ SOAJ
0.2500 mg | SUBCUTANEOUS | 0 refills | Status: AC
Start: 2024-03-09 — End: ?

## 2024-03-09 NOTE — Telephone Encounter (Signed)
(  Key: [id number])  GNFAOZ 0.25MG /0.5ML auto-injectors  Form  Dealer PA Form [id number] NCPDP)

## 2024-03-30 DIAGNOSIS — M7672 Peroneal tendinitis, left leg: Secondary | ICD-10-CM | POA: Diagnosis not present

## 2024-04-18 DIAGNOSIS — M79672 Pain in left foot: Secondary | ICD-10-CM | POA: Diagnosis not present
